# Patient Record
Sex: Female | Born: 1955 | Race: White | Hispanic: No | Marital: Single | Smoking: Never smoker
Health system: Southern US, Community
[De-identification: ages and names within clinical notes are randomized; demographics above are authoritative.]

## PROBLEM LIST (undated history)

## (undated) DIAGNOSIS — D649 Anemia, unspecified: Secondary | ICD-10-CM

## (undated) DIAGNOSIS — K760 Fatty (change of) liver, not elsewhere classified: Secondary | ICD-10-CM

## (undated) DIAGNOSIS — K219 Gastro-esophageal reflux disease without esophagitis: Secondary | ICD-10-CM

## (undated) DIAGNOSIS — C8330 Diffuse large B-cell lymphoma, unspecified site: Secondary | ICD-10-CM

## (undated) HISTORY — DX: Diffuse large B-cell lymphoma, unspecified site: C83.30

## (undated) HISTORY — DX: Gastro-esophageal reflux disease without esophagitis: K21.9

## (undated) HISTORY — DX: Fatty (change of) liver, not elsewhere classified: K76.0

## (undated) HISTORY — DX: Anemia, unspecified: D64.9

---

## 2002-04-25 HISTORY — PX: UPPER GI ENDOSCOPY: SHX6162

## 2004-03-25 ENCOUNTER — Ambulatory Visit: Payer: Self-pay

## 2005-06-09 ENCOUNTER — Ambulatory Visit: Payer: Self-pay

## 2007-02-08 ENCOUNTER — Ambulatory Visit: Payer: Self-pay

## 2008-03-19 ENCOUNTER — Ambulatory Visit: Payer: Self-pay

## 2010-04-25 HISTORY — PX: COLONOSCOPY: SHX174

## 2010-04-29 ENCOUNTER — Ambulatory Visit: Payer: Self-pay | Admitting: Internal Medicine

## 2011-06-01 ENCOUNTER — Ambulatory Visit: Payer: Self-pay | Admitting: Internal Medicine

## 2012-08-29 ENCOUNTER — Ambulatory Visit: Payer: Self-pay | Admitting: Internal Medicine

## 2013-04-05 ENCOUNTER — Ambulatory Visit: Payer: Self-pay | Admitting: Family Medicine

## 2013-04-16 ENCOUNTER — Ambulatory Visit (INDEPENDENT_AMBULATORY_CARE_PROVIDER_SITE_OTHER): Payer: BC Managed Care – PPO | Admitting: General Surgery

## 2013-04-16 ENCOUNTER — Encounter: Payer: Self-pay | Admitting: General Surgery

## 2013-04-16 VITALS — BP 130/72 | HR 76 | Resp 12 | Ht 66.0 in | Wt 208.0 lb

## 2013-04-16 DIAGNOSIS — K8010 Calculus of gallbladder with chronic cholecystitis without obstruction: Secondary | ICD-10-CM | POA: Insufficient documentation

## 2013-04-16 NOTE — Progress Notes (Signed)
Patient ID: April Nolan, female   DOB: 04-Oct-1955, 57 y.o.   MRN: [id number]  Chief Complaint  Patient presents with  . Abdominal Pain    gall stones    HPI April Nolan is a 57 y.o. female who presents for an evaluation of gallstones. She complains of epigastric abdominal pain that has been present for approximately 4-5 months. She describes the pain as a dull ache as well as stabbing pain at times. She also mentions a feeling of fullness. The pain is constantly there. Eating makes the pain worse. The pain lasts for approximately a few minutes at a time. No nausea or vomiting.    HPI  Past Medical History  Diagnosis Date  . GERD (gastroesophageal reflux disease)     Past Surgical History  Procedure Laterality Date  . Upper gi endoscopy  2004    Dr Mechele Collin  . Colonoscopy  2012    Dr. Mechele Collin    Family History  Problem Relation Age of Onset  . Cancer Father     bone/brain    Social History History  Substance Use Topics  . Smoking status: Never Smoker   . Smokeless tobacco: Not on file  . Alcohol Use: Yes     Comment: 4/week    No Known Allergies  Current Outpatient Prescriptions  Medication Sig Dispense Refill  . Cholecalciferol (VITAMIN D-3) 1000 UNITS CAPS Take by mouth daily.      . citalopram (CELEXA) 20 MG tablet Take 20 mg by mouth daily.       . DiphenhydrAMINE HCl (ALLERGY MED PO) Take by mouth daily.      Marland Kitchen esomeprazole (NEXIUM) 10 MG packet Take 10 mg by mouth daily before breakfast.      . omeprazole (PRILOSEC) 40 MG capsule Take 40 mg by mouth daily.        No current facility-administered medications for this visit.    Review of Systems Review of Systems  Constitutional: Negative.   Respiratory: Negative.   Cardiovascular: Negative.   Gastrointestinal: Positive for abdominal pain.    Blood pressure 130/72, pulse 76, resp. rate 12, height 5\' 6"  (1.676 m), weight 208 lb (94.348 kg).  Physical Exam Physical Exam  Constitutional: She is  oriented to person, place, and time. She appears well-developed and well-nourished.  Eyes: Conjunctivae are normal. No scleral icterus.  Neck: No thyromegaly present.  Cardiovascular: Normal rate, regular rhythm and normal heart sounds.   No murmur heard. Pulmonary/Chest: Effort normal and breath sounds normal.  Abdominal: Soft. Normal appearance and bowel sounds are normal. There is no hepatosplenomegaly. There is no tenderness. No hernia.  Lymphadenopathy:    She has no cervical adenopathy.  Neurological: She is alert and oriented to person, place, and time.  Skin: Skin is warm and dry.    Data Reviewed  Abdominal Ultrasound. MD notes and labs.   Assessment    Symptomatic cholelithiasis. Labs were normal.     Plan    Discussed laparoscopy and cholecystectomy in full. Patient is agreeable.     Patient wishes to check work schedule and call back to arrange surgery date.   Cathy Ropp G 04/16/2013, 3:31 PM

## 2013-04-16 NOTE — Patient Instructions (Addendum)
Patient advised to arrange gallbladder surgery at sometime.   Laparoscopic Cholecystectomy Laparoscopic cholecystectomy is surgery to remove the gallbladder. The gallbladder is located slightly to the right of center in the abdomen, behind the liver. It is a concentrating and storage sac for the bile produced in the liver. Bile aids in the digestion and absorption of fats. Gallbladder disease (cholecystitis) is an inflammation of your gallbladder. This condition is usually caused by a buildup of gallstones (cholelithiasis) in your gallbladder. Gallstones can block the flow of bile, resulting in inflammation and pain. In severe cases, emergency surgery may be required. When emergency surgery is not required, you will have time to prepare for the procedure. Laparoscopic surgery is an alternative to open surgery. Laparoscopic surgery usually has a shorter recovery time. Your common bile duct may also need to be examined and explored. Your caregiver will discuss this with you if he or she feels this should be done. If stones are found in the common bile duct, they may be removed. LET YOUR CAREGIVER KNOW ABOUT:  Allergies to food or medicine.  Medicines taken, including vitamins, herbs, eyedrops, over-the-counter medicines, and creams.  Use of steroids (by mouth or creams).  Previous problems with anesthetics or numbing medicines.  History of bleeding problems or blood clots.  Previous surgery.  Other health problems, including diabetes and kidney problems.  Possibility of pregnancy, if this applies. RISKS AND COMPLICATIONS All surgery is associated with risks. Some problems that may occur following this procedure include:  Infection.  Damage to the common bile duct, nerves, arteries, veins, or other internal organs such as the stomach or intestines.  Bleeding.  A stone may remain in the common bile duct. BEFORE THE PROCEDURE  Do not take aspirin for 3 days prior to surgery or blood  thinners for 1 week prior to surgery.  Do not eat or drink anything after midnight the night before surgery.  Let your caregiver know if you develop a cold or other infectious problem prior to surgery.  You should be present 60 minutes before the procedure or as directed. PROCEDURE  You will be given medicine that makes you sleep (general anesthetic). When you are asleep, your surgeon will make several small cuts (incisions) in your abdomen. One of these incisions is used to insert a small, lighted scope (laparoscope) into the abdomen. The laparoscope helps the surgeon see into your abdomen. Carbon dioxide gas will be pumped into your abdomen. The gas allows more room for the surgeon to perform your surgery. Other operating instruments are inserted through the other incisions. Laparoscopic procedures may not be appropriate when:  There is major scarring from previous surgery.  The gallbladder is extremely inflamed.  There are bleeding disorders or unexpected cirrhosis of the liver.  A pregnancy is near term.  Other conditions make the laparoscopic procedure impossible. If your surgeon feels it is not safe to continue with a laparoscopic procedure, he or she will perform an open abdominal procedure. In this case, the surgeon will make an incision to open the abdomen. This gives the surgeon a larger view and field to work within. This may allow the surgeon to perform procedures that sometimes cannot be performed with a laparoscope alone. Open surgery has a longer recovery time. AFTER THE PROCEDURE  You will be taken to the recovery area where a nurse will watch and check your progress.  You may be allowed to go home the same day.  Do not resume physical activities until directed by  your caregiver.  You may resume a normal diet and activities as directed. Document Released: 04/11/2005 Document Revised: 07/04/2011 Document Reviewed: 11/21/2012 [facility] Patient Information 2014 [place],  [place].  Patient wishes to check work schedule and call back to arrange surgery date.

## 2013-04-23 ENCOUNTER — Ambulatory Visit: Payer: Self-pay | Admitting: General Surgery

## 2013-05-01 ENCOUNTER — Ambulatory Visit: Payer: Self-pay | Admitting: General Surgery

## 2013-12-06 ENCOUNTER — Ambulatory Visit: Payer: Self-pay | Admitting: Unknown Physician Specialty

## 2014-01-02 ENCOUNTER — Ambulatory Visit: Payer: Self-pay | Admitting: Surgery

## 2014-01-03 LAB — PATHOLOGY REPORT

## 2014-02-24 ENCOUNTER — Encounter: Payer: Self-pay | Admitting: General Surgery

## 2014-03-25 ENCOUNTER — Ambulatory Visit: Payer: Self-pay | Admitting: Oncology

## 2014-04-14 ENCOUNTER — Inpatient Hospital Stay: Payer: Self-pay | Admitting: Internal Medicine

## 2014-04-14 LAB — CBC
HCT: 23.2 % — ABNORMAL LOW (ref 35.0–47.0)
HGB: 6.7 g/dL — ABNORMAL LOW (ref 12.0–16.0)
MCH: 18.8 pg — ABNORMAL LOW (ref 26.0–34.0)
MCHC: 28.9 g/dL — ABNORMAL LOW (ref 32.0–36.0)
MCV: 65 fL — ABNORMAL LOW (ref 80–100)
Platelet: 809 10*3/uL — ABNORMAL HIGH (ref 150–440)
RBC: 3.56 10*6/uL — AB (ref 3.80–5.20)
RDW: 18.9 % — AB (ref 11.5–14.5)
WBC: 9.7 10*3/uL (ref 3.6–11.0)

## 2014-04-14 LAB — FERRITIN: Ferritin (ARMC): 10 ng/mL (ref 8–388)

## 2014-04-14 LAB — COMPREHENSIVE METABOLIC PANEL
ALBUMIN: 3 g/dL — AB (ref 3.4–5.0)
ALT: 13 U/L — AB
ANION GAP: 8 (ref 7–16)
AST: 28 U/L (ref 15–37)
Alkaline Phosphatase: 88 U/L
BILIRUBIN TOTAL: 0.7 mg/dL (ref 0.2–1.0)
BUN: 14 mg/dL (ref 7–18)
Calcium, Total: 8.6 mg/dL (ref 8.5–10.1)
Chloride: 105 mmol/L (ref 98–107)
Co2: 25 mmol/L (ref 21–32)
Creatinine: 0.89 mg/dL (ref 0.60–1.30)
EGFR (Non-African Amer.): >60
GLUCOSE: 130 mg/dL — AB (ref 65–99)
Osmolality: 278 (ref 275–301)
Potassium: 3.8 mmol/L (ref 3.5–5.1)
Sodium: 138 mmol/L (ref 136–145)
Total Protein: 7 g/dL (ref 6.4–8.2)

## 2014-04-14 LAB — HEMOGLOBIN: HGB: 7.1 g/dL — ABNORMAL LOW (ref 12.0–16.0)

## 2014-04-14 LAB — IRON: Iron: 17 ug/dL — ABNORMAL LOW (ref 50–170)

## 2014-04-14 LAB — TROPONIN I: Troponin-I: <0.02 ng/mL

## 2014-04-15 LAB — BASIC METABOLIC PANEL
Anion Gap: 7 (ref 7–16)
BUN: 13 mg/dL (ref 7–18)
CALCIUM: 8.1 mg/dL — AB (ref 8.5–10.1)
CREATININE: 0.81 mg/dL (ref 0.60–1.30)
Chloride: 107 mmol/L (ref 98–107)
Co2: 26 mmol/L (ref 21–32)
EGFR (African American): >60
EGFR (Non-African Amer.): >60
GLUCOSE: 111 mg/dL — AB (ref 65–99)
Osmolality: 280 (ref 275–301)
Potassium: 4.1 mmol/L (ref 3.5–5.1)
Sodium: 140 mmol/L (ref 136–145)

## 2014-04-15 LAB — CBC WITH DIFFERENTIAL/PLATELET
Bands: 1 %
Basophil: 1 %
COMMENT - H1-COM8: NORMAL
Eosinophil: 3 %
HCT: 23.4 % — AB (ref 35.0–47.0)
HGB: 6.9 g/dL — ABNORMAL LOW (ref 12.0–16.0)
Lymphocytes: 10 %
MCH: 20.4 pg — ABNORMAL LOW (ref 26.0–34.0)
MCHC: 29.5 g/dL — AB (ref 32.0–36.0)
MCV: 69 fL — ABNORMAL LOW (ref 80–100)
METAMYELOCYTE: 1 %
Monocytes: 9 %
NRBC/100 WBC: 2 /
PLATELETS: 667 10*3/uL — AB (ref 150–440)
RBC: 3.39 10*6/uL — AB (ref 3.80–5.20)
RDW: 19.2 % — ABNORMAL HIGH (ref 11.5–14.5)
SEGMENTED NEUTROPHILS: 75 %
WBC: 8.6 10*3/uL (ref 3.6–11.0)

## 2014-04-15 LAB — HEMOGLOBIN: HGB: 7 g/dL — ABNORMAL LOW (ref 12.0–16.0)

## 2014-04-16 LAB — HEMOGLOBIN: HGB: 8.2 g/dL — ABNORMAL LOW (ref 12.0–16.0)

## 2014-04-17 LAB — CEA

## 2014-04-17 LAB — CA 125: CA 125: 47.5 U/mL — AB (ref 0.0–34.0)

## 2014-04-22 ENCOUNTER — Ambulatory Visit: Payer: Self-pay | Admitting: Internal Medicine

## 2014-04-22 LAB — CBC CANCER CENTER
Basophil #: 0.1 x10 3/mm (ref 0.0–0.1)
Basophil %: 1.1 %
EOS PCT: 1.5 %
Eosinophil #: 0.1 x10 3/mm (ref 0.0–0.7)
HCT: 31 % — ABNORMAL LOW (ref 35.0–47.0)
HGB: 9.4 g/dL — ABNORMAL LOW (ref 12.0–16.0)
Lymphocyte #: 0.7 x10 3/mm — ABNORMAL LOW (ref 1.0–3.6)
Lymphocyte %: 9.6 %
MCH: 21.7 pg — ABNORMAL LOW (ref 26.0–34.0)
MCHC: 30.3 g/dL — ABNORMAL LOW (ref 32.0–36.0)
MCV: 72 fL — ABNORMAL LOW (ref 80–100)
Monocyte #: 0.7 x10 3/mm (ref 0.2–0.9)
Monocyte %: 9.7 %
NEUTROS ABS: 5.5 x10 3/mm (ref 1.4–6.5)
Neutrophil %: 78.1 %
Platelet: 399 x10 3/mm (ref 150–440)
RBC: 4.33 10*6/uL (ref 3.80–5.20)
RDW: 29.5 % — AB (ref 11.5–14.5)
WBC: 7 x10 3/mm (ref 3.6–11.0)

## 2014-04-24 ENCOUNTER — Ambulatory Visit: Payer: Self-pay | Admitting: Oncology

## 2014-04-25 ENCOUNTER — Ambulatory Visit: Payer: Self-pay | Admitting: Oncology

## 2014-04-28 LAB — CBC CANCER CENTER
Basophil #: 0.1 x10 3/mm (ref 0.0–0.1)
Basophil %: 0.8 %
Eosinophil #: 0.1 x10 3/mm (ref 0.0–0.7)
Eosinophil %: 0.7 %
HCT: 29.8 % — AB (ref 35.0–47.0)
HGB: 9.1 g/dL — ABNORMAL LOW (ref 12.0–16.0)
LYMPHS ABS: 0.5 x10 3/mm — AB (ref 1.0–3.6)
LYMPHS PCT: 4.6 %
MCH: 21.3 pg — ABNORMAL LOW (ref 26.0–34.0)
MCHC: 30.6 g/dL — ABNORMAL LOW (ref 32.0–36.0)
MCV: 70 fL — ABNORMAL LOW (ref 80–100)
MONOS PCT: 6.9 %
Monocyte #: 0.8 x10 3/mm (ref 0.2–0.9)
NEUTROS ABS: 9.8 x10 3/mm — AB (ref 1.4–6.5)
Neutrophil %: 87 %
Platelet: 505 x10 3/mm — ABNORMAL HIGH (ref 150–440)
RBC: 4.29 10*6/uL (ref 3.80–5.20)
RDW: 28.5 % — ABNORMAL HIGH (ref 11.5–14.5)
WBC: 11.2 x10 3/mm — AB (ref 3.6–11.0)

## 2014-05-01 ENCOUNTER — Ambulatory Visit: Payer: BC Managed Care – PPO | Admitting: General Surgery

## 2014-05-05 DIAGNOSIS — C8510 Unspecified B-cell lymphoma, unspecified site: Secondary | ICD-10-CM | POA: Insufficient documentation

## 2014-05-05 DIAGNOSIS — C8330 Diffuse large B-cell lymphoma, unspecified site: Secondary | ICD-10-CM

## 2014-05-05 HISTORY — DX: Diffuse large B-cell lymphoma, unspecified site: C83.30

## 2014-05-06 DIAGNOSIS — R1900 Intra-abdominal and pelvic swelling, mass and lump, unspecified site: Secondary | ICD-10-CM | POA: Insufficient documentation

## 2014-05-12 LAB — CBC CANCER CENTER
BANDS NEUTROPHIL: 6 %
BASOS ABS: 0.1 x10 3/mm (ref 0.0–0.1)
Basophil %: 0.2 %
EOS ABS: 0.1 x10 3/mm (ref 0.0–0.7)
EOS PCT: 0.6 %
HCT: 33.9 % — ABNORMAL LOW (ref 35.0–47.0)
HGB: 10.3 g/dL — ABNORMAL LOW (ref 12.0–16.0)
LYMPHS PCT: 4 %
Lymphocyte #: 0.5 x10 3/mm — ABNORMAL LOW (ref 1.0–3.6)
Lymphocyte %: 2.1 %
MCH: 20.8 pg — AB (ref 26.0–34.0)
MCHC: 30.4 g/dL — ABNORMAL LOW (ref 32.0–36.0)
MCV: 69 fL — ABNORMAL LOW (ref 80–100)
MONO ABS: 1.9 x10 3/mm — AB (ref 0.2–0.9)
MONOS PCT: 7.5 %
Metamyelocyte: 3 %
Monocytes: 9 %
Myelocyte: 2 %
NRBC/100 WBC: 1 /100
Neutrophil #: 22.2 x10 3/mm — ABNORMAL HIGH (ref 1.4–6.5)
Neutrophil %: 89.6 %
PLATELETS: 799 x10 3/mm — AB (ref 150–440)
RBC: 4.95 10*6/uL (ref 3.80–5.20)
RDW: 26.9 % — ABNORMAL HIGH (ref 11.5–14.5)
Segmented Neutrophils: 76 %
WBC: 24.8 x10 3/mm — AB (ref 3.6–11.0)

## 2014-05-12 LAB — COMPREHENSIVE METABOLIC PANEL
ALBUMIN: 2.6 g/dL — AB (ref 3.4–5.0)
ANION GAP: 10 (ref 7–16)
AST: 44 U/L — AB (ref 15–37)
Alkaline Phosphatase: 153 U/L — ABNORMAL HIGH
BUN: 17 mg/dL (ref 7–18)
Bilirubin,Total: 0.7 mg/dL (ref 0.2–1.0)
CHLORIDE: 92 mmol/L — AB (ref 98–107)
Calcium, Total: 12.3 mg/dL — ABNORMAL HIGH (ref 8.5–10.1)
Co2: 28 mmol/L (ref 21–32)
Creatinine: 1.04 mg/dL (ref 0.60–1.30)
EGFR (Non-African Amer.): 58 — ABNORMAL LOW
GLUCOSE: 120 mg/dL — AB (ref 65–99)
Osmolality: 264 (ref 275–301)
POTASSIUM: 4.7 mmol/L (ref 3.5–5.1)
SGPT (ALT): 44 U/L
Sodium: 130 mmol/L — ABNORMAL LOW (ref 136–145)
Total Protein: 6.9 g/dL (ref 6.4–8.2)

## 2014-05-12 LAB — LACTATE DEHYDROGENASE: LDH: 276 U/L — AB (ref 81–246)

## 2014-05-15 LAB — CREATININE, SERUM
CREATININE: 0.87 mg/dL (ref 0.60–1.30)
EGFR (African American): >60
EGFR (Non-African Amer.): >60

## 2014-05-15 LAB — CALCIUM: Calcium, Total: 10.2 mg/dL — ABNORMAL HIGH (ref 8.5–10.1)

## 2014-05-15 LAB — URIC ACID: Uric Acid: 3.9 mg/dL (ref 2.6–6.0)

## 2014-05-15 LAB — CANCER CENTER HEMOGLOBIN: HGB: 9 g/dL — AB (ref 12.0–16.0)

## 2014-05-16 LAB — CALCIUM: Calcium, Total: 10 mg/dL (ref 8.5–10.1)

## 2014-05-16 LAB — URIC ACID: Uric Acid: 3 mg/dL (ref 2.6–6.0)

## 2014-05-16 LAB — CREATININE, SERUM
CREATININE: 0.75 mg/dL (ref 0.60–1.30)
EGFR (Non-African Amer.): >60

## 2014-05-16 LAB — CANCER CENTER HEMOGLOBIN: HGB: 9 g/dL — AB (ref 12.0–16.0)

## 2014-05-19 LAB — BASIC METABOLIC PANEL
Anion Gap: 9 (ref 7–16)
BUN: 16 mg/dL (ref 7–18)
Calcium, Total: 8.6 mg/dL (ref 8.5–10.1)
Chloride: 99 mmol/L (ref 98–107)
Co2: 28 mmol/L (ref 21–32)
Creatinine: 0.62 mg/dL (ref 0.60–1.30)
EGFR (African American): >60
EGFR (Non-African Amer.): >60
Glucose: 187 mg/dL — ABNORMAL HIGH (ref 65–99)
OSMOLALITY: 278 (ref 275–301)
POTASSIUM: 4 mmol/L (ref 3.5–5.1)
SODIUM: 136 mmol/L (ref 136–145)

## 2014-05-19 LAB — CBC CANCER CENTER
BASOS PCT: 0.3 %
Basophil #: 0 x10 3/mm (ref 0.0–0.1)
EOS ABS: 0 x10 3/mm (ref 0.0–0.7)
EOS PCT: 0 %
HCT: 26.9 % — ABNORMAL LOW (ref 35.0–47.0)
HGB: 8.2 g/dL — AB (ref 12.0–16.0)
Lymphocyte #: 0.1 x10 3/mm — ABNORMAL LOW (ref 1.0–3.6)
Lymphocyte %: 1.1 %
MCH: 21 pg — ABNORMAL LOW (ref 26.0–34.0)
MCHC: 30.6 g/dL — ABNORMAL LOW (ref 32.0–36.0)
MCV: 68 fL — ABNORMAL LOW (ref 80–100)
MONO ABS: 0 x10 3/mm — AB (ref 0.2–0.9)
MONOS PCT: 0.4 %
NEUTROS ABS: 9.1 x10 3/mm — AB (ref 1.4–6.5)
NEUTROS PCT: 98.2 %
PLATELETS: 333 x10 3/mm (ref 150–440)
RBC: 3.93 10*6/uL (ref 3.80–5.20)
RDW: 27.2 % — ABNORMAL HIGH (ref 11.5–14.5)
WBC: 9.2 x10 3/mm (ref 3.6–11.0)

## 2014-05-21 LAB — CBC CANCER CENTER
BASOS ABS: 0 x10 3/mm (ref 0.0–0.1)
Basophil %: 0.1 %
EOS PCT: 2.3 %
Eosinophil #: 0 x10 3/mm (ref 0.0–0.7)
HCT: 24.6 % — AB (ref 35.0–47.0)
HGB: 7.4 g/dL — ABNORMAL LOW (ref 12.0–16.0)
LYMPHS ABS: 0.1 x10 3/mm — AB (ref 1.0–3.6)
LYMPHS PCT: 28.1 %
MCH: 20.7 pg — ABNORMAL LOW (ref 26.0–34.0)
MCHC: 29.9 g/dL — ABNORMAL LOW (ref 32.0–36.0)
MCV: 69 fL — AB (ref 80–100)
MONOS PCT: 18.8 %
Monocyte #: 0.1 x10 3/mm — ABNORMAL LOW (ref 0.2–0.9)
Neutrophil #: 0.2 x10 3/mm — ABNORMAL LOW (ref 1.4–6.5)
Neutrophil %: 50.7 %
Platelet: 263 x10 3/mm (ref 150–440)
RBC: 3.56 10*6/uL — AB (ref 3.80–5.20)
RDW: 26.2 % — AB (ref 11.5–14.5)
WBC: 0.3 x10 3/mm — AB (ref 3.6–11.0)

## 2014-05-22 LAB — IRON AND TIBC
IRON: 15 ug/dL — AB (ref 50–170)
Iron Bind.Cap.(Total): 296 ug/dL (ref 250–450)
Iron Saturation: 5 %
UNBOUND IRON-BIND. CAP.: 281 ug/dL

## 2014-05-22 LAB — FERRITIN: Ferritin (ARMC): 114 ng/mL (ref 8–388)

## 2014-05-26 ENCOUNTER — Ambulatory Visit: Payer: Self-pay | Admitting: Oncology

## 2014-05-27 ENCOUNTER — Ambulatory Visit: Payer: Self-pay | Admitting: Vascular Surgery

## 2014-05-28 LAB — CBC CANCER CENTER
BASOS ABS: 0 x10 3/mm (ref 0.0–0.1)
Basophil %: 0.2 %
Eosinophil #: 0 x10 3/mm (ref 0.0–0.7)
Eosinophil %: 0 %
HCT: 36.2 % (ref 35.0–47.0)
HGB: 11.2 g/dL — ABNORMAL LOW (ref 12.0–16.0)
LYMPHS ABS: 0.5 x10 3/mm — AB (ref 1.0–3.6)
Lymphocyte %: 2 %
MCH: 23.2 pg — ABNORMAL LOW (ref 26.0–34.0)
MCHC: 31 g/dL — AB (ref 32.0–36.0)
MCV: 75 fL — AB (ref 80–100)
MONOS PCT: 1.7 %
Monocyte #: 0.4 x10 3/mm (ref 0.2–0.9)
Neutrophil #: 24.6 x10 3/mm — ABNORMAL HIGH (ref 1.4–6.5)
Neutrophil %: 96.1 %
PLATELETS: 441 x10 3/mm — AB (ref 150–440)
RBC: 4.83 10*6/uL (ref 3.80–5.20)
RDW: 29.8 % — AB (ref 11.5–14.5)
WBC: 25.6 x10 3/mm — AB (ref 3.6–11.0)

## 2014-06-17 ENCOUNTER — Inpatient Hospital Stay: Payer: Self-pay | Admitting: Internal Medicine

## 2014-06-24 ENCOUNTER — Ambulatory Visit
Admit: 2014-06-24 | Disposition: A | Discharge status: Home / Self Care | Payer: Self-pay | Attending: Oncology | Admitting: Oncology

## 2014-07-22 LAB — COMPREHENSIVE METABOLIC PANEL
ALBUMIN: 3 g/dL — AB
ANION GAP: 6 — AB (ref 7–16)
AST: 18 U/L
Alkaline Phosphatase: 69 U/L
BUN: 13 mg/dL
Bilirubin,Total: 0.6 mg/dL
CALCIUM: 8.7 mg/dL — AB
Chloride: 109 mmol/L
Co2: 26 mmol/L
Creatinine: 0.48 mg/dL
EGFR (African American): >60
EGFR (Non-African Amer.): >60
GLUCOSE: 106 mg/dL — AB
Potassium: 3.6 mmol/L
SGPT (ALT): 9 U/L — ABNORMAL LOW
Sodium: 141 mmol/L
Total Protein: 5.4 g/dL — ABNORMAL LOW

## 2014-07-22 LAB — MAGNESIUM: MAGNESIUM: 1.5 mg/dL — AB

## 2014-07-22 LAB — CBC CANCER CENTER
BASOS PCT: 0.9 %
Basophil #: 0.1 x10 3/mm (ref 0.0–0.1)
EOS ABS: 0.1 x10 3/mm (ref 0.0–0.7)
Eosinophil %: 0.6 %
HCT: 31.1 % — AB (ref 35.0–47.0)
HGB: 10.2 g/dL — ABNORMAL LOW (ref 12.0–16.0)
Lymphocyte #: 0.7 x10 3/mm — ABNORMAL LOW (ref 1.0–3.6)
Lymphocyte %: 6.9 %
MCH: 28.2 pg (ref 26.0–34.0)
MCHC: 32.8 g/dL (ref 32.0–36.0)
MCV: 86 fL (ref 80–100)
Monocyte #: 1.2 x10 3/mm — ABNORMAL HIGH (ref 0.2–0.9)
Monocyte %: 11.4 %
NEUTROS ABS: 8.5 x10 3/mm — AB (ref 1.4–6.5)
Neutrophil %: 80.2 %
Platelet: 396 x10 3/mm (ref 150–440)
RBC: 3.62 10*6/uL — AB (ref 3.80–5.20)
RDW: 24.7 % — ABNORMAL HIGH (ref 11.5–14.5)
WBC: 10.7 x10 3/mm (ref 3.6–11.0)

## 2014-07-22 LAB — LACTATE DEHYDROGENASE: LDH: 202 U/L — ABNORMAL HIGH

## 2014-07-25 ENCOUNTER — Ambulatory Visit
Admit: 2014-07-25 | Disposition: A | Discharge status: Home / Self Care | Payer: Self-pay | Attending: Oncology | Admitting: Oncology

## 2014-07-28 LAB — CREATININE, SERUM: CREATINE, SERUM: 0.48

## 2014-07-29 LAB — CBC CANCER CENTER
Basophil #: 0 x10 3/mm (ref 0.0–0.1)
Basophil %: 2.7 %
EOS PCT: 4.7 %
Eosinophil #: 0.1 x10 3/mm (ref 0.0–0.7)
HCT: 32.7 % — ABNORMAL LOW (ref 35.0–47.0)
HGB: 10.9 g/dL — AB (ref 12.0–16.0)
Lymphocyte #: 0.3 x10 3/mm — ABNORMAL LOW (ref 1.0–3.6)
Lymphocyte %: 18.7 %
MCH: 29 pg (ref 26.0–34.0)
MCHC: 33.2 g/dL (ref 32.0–36.0)
MCV: 87 fL (ref 80–100)
Monocyte #: 0.2 x10 3/mm (ref 0.2–0.9)
Monocyte %: 12.7 %
NEUTROS PCT: 61.2 %
Neutrophil #: 0.8 x10 3/mm — ABNORMAL LOW (ref 1.4–6.5)
PLATELETS: 194 x10 3/mm (ref 150–440)
RBC: 3.75 10*6/uL — AB (ref 3.80–5.20)
RDW: 22.2 % — ABNORMAL HIGH (ref 11.5–14.5)
WBC: 1.4 x10 3/mm — CL (ref 3.6–11.0)

## 2014-07-29 LAB — COMPREHENSIVE METABOLIC PANEL
Albumin: 3.7 g/dL
Alkaline Phosphatase: 77 U/L
Anion Gap: 7 (ref 7–16)
BILIRUBIN TOTAL: 0.9 mg/dL
BUN: 14 mg/dL
Calcium, Total: 9 mg/dL
Chloride: 100 mmol/L — ABNORMAL LOW
Co2: 30 mmol/L
Creatinine: 0.64 mg/dL
Glucose: 118 mg/dL — ABNORMAL HIGH
POTASSIUM: 4 mmol/L
SGOT(AST): 13 U/L — ABNORMAL LOW
SGPT (ALT): 19 U/L
SODIUM: 137 mmol/L
Total Protein: 6.2 g/dL — ABNORMAL LOW

## 2014-08-05 LAB — BASIC METABOLIC PANEL
Anion Gap: 8 (ref 7–16)
BUN: 12 mg/dL
CREATININE: 0.55 mg/dL
Calcium, Total: 9.4 mg/dL
Chloride: 105 mmol/L
Co2: 25 mmol/L
Glucose: 119 mg/dL — ABNORMAL HIGH
POTASSIUM: 3.6 mmol/L
SODIUM: 138 mmol/L

## 2014-08-05 LAB — CBC CANCER CENTER
BASOS ABS: 0.1 x10 3/mm (ref 0.0–0.1)
BASOS PCT: 1 %
EOS ABS: 0.2 x10 3/mm (ref 0.0–0.7)
Eosinophil %: 1.2 %
HCT: 36.3 % (ref 35.0–47.0)
HGB: 11.7 g/dL — AB (ref 12.0–16.0)
LYMPHS PCT: 3.5 %
Lymphocyte #: 0.5 x10 3/mm — ABNORMAL LOW (ref 1.0–3.6)
MCH: 28.7 pg (ref 26.0–34.0)
MCHC: 32.3 g/dL (ref 32.0–36.0)
MCV: 89 fL (ref 80–100)
Monocyte #: 1 x10 3/mm — ABNORMAL HIGH (ref 0.2–0.9)
Monocyte %: 7.3 %
NEUTROS PCT: 87 %
Neutrophil #: 12.4 x10 3/mm — ABNORMAL HIGH (ref 1.4–6.5)
Platelet: 212 x10 3/mm (ref 150–440)
RBC: 4.07 10*6/uL (ref 3.80–5.20)
RDW: 21.6 % — AB (ref 11.5–14.5)
WBC: 14.2 x10 3/mm — ABNORMAL HIGH (ref 3.6–11.0)

## 2014-08-06 ENCOUNTER — Inpatient Hospital Stay
Admit: 2014-08-06 | Disposition: A | Discharge status: Home / Self Care | Payer: Self-pay | Attending: Oncology | Admitting: Oncology

## 2014-08-06 LAB — URINE PH
PH: 8 (ref 4.5–8.0)
PH: 9 (ref 4.5–8.0)
PH: 8 (ref 4.5–8.0)
PH: 9 (ref 4.5–8.0)
Ph: 5 (ref 4.5–8.0)
Ph: 9 (ref 4.5–8.0)
Ph: 8 (ref 4.5–8.0)

## 2014-08-07 LAB — CBC WITH DIFFERENTIAL/PLATELET
Bands: 16 %
HCT: 31.3 % — AB (ref 35.0–47.0)
HGB: 10.3 g/dL — AB (ref 12.0–16.0)
Lymphocytes: 3 %
MCH: 28.9 pg (ref 26.0–34.0)
MCHC: 32.8 g/dL (ref 32.0–36.0)
MCV: 88 fL (ref 80–100)
METAMYELOCYTE: 2 %
Monocytes: 2 %
Platelet: 220 10*3/uL (ref 150–440)
RBC: 3.55 10*6/uL — ABNORMAL LOW (ref 3.80–5.20)
RDW: 21 % — AB (ref 11.5–14.5)
SEGMENTED NEUTROPHILS: 77 %
WBC: 12.6 10*3/uL — AB (ref 3.6–11.0)

## 2014-08-07 LAB — BASIC METABOLIC PANEL
Anion Gap: 8 (ref 7–16)
BUN: 13 mg/dL
CHLORIDE: 103 mmol/L
CO2: 28 mmol/L
Calcium, Total: 8.9 mg/dL
Creatinine: 0.48 mg/dL
EGFR (African American): >60
EGFR (Non-African Amer.): >60
Glucose: 151 mg/dL — ABNORMAL HIGH
Potassium: 3.4 mmol/L — ABNORMAL LOW
SODIUM: 139 mmol/L

## 2014-08-07 LAB — URINE PH
PH: 9 (ref 4.5–8.0)
PH: 9 (ref 4.5–8.0)
PH: 9 (ref 4.5–8.0)
PH: 9 (ref 4.5–8.0)
PH: 9 (ref 4.5–8.0)
Ph: 9 (ref 4.5–8.0)
Ph: 9 (ref 4.5–8.0)
Ph: 9 (ref 4.5–8.0)
Ph: 9 (ref 4.5–8.0)
Ph: 9 (ref 4.5–8.0)
Ph: 9 (ref 4.5–8.0)
Ph: 9 (ref 4.5–8.0)
Ph: 9 (ref 4.5–8.0)

## 2014-08-08 LAB — CBC WITH DIFFERENTIAL/PLATELET
Basophil #: 0 10*3/uL (ref 0.0–0.1)
Basophil %: 0.6 %
EOS PCT: 0.8 %
Eosinophil #: 0 10*3/uL (ref 0.0–0.7)
HCT: 28.2 % — AB (ref 35.0–47.0)
HGB: 9.2 g/dL — AB (ref 12.0–16.0)
Lymphocyte #: 0.4 10*3/uL — ABNORMAL LOW (ref 1.0–3.6)
Lymphocyte %: 7.3 %
MCH: 29.2 pg (ref 26.0–34.0)
MCHC: 32.7 g/dL (ref 32.0–36.0)
MCV: 89 fL (ref 80–100)
MONO ABS: 0.4 x10 3/mm (ref 0.2–0.9)
MONOS PCT: 8.7 %
NEUTROS ABS: 4.1 10*3/uL (ref 1.4–6.5)
Neutrophil %: 82.6 %
Platelet: 213 10*3/uL (ref 150–440)
RBC: 3.15 10*6/uL — ABNORMAL LOW (ref 3.80–5.20)
RDW: 20.7 % — AB (ref 11.5–14.5)
WBC: 5 10*3/uL (ref 3.6–11.0)

## 2014-08-08 LAB — BASIC METABOLIC PANEL
Anion Gap: 6 — ABNORMAL LOW (ref 7–16)
BUN: 10 mg/dL
CO2: 29 mmol/L
Calcium, Total: 8.4 mg/dL — ABNORMAL LOW
Chloride: 106 mmol/L
Creatinine: 0.48 mg/dL
EGFR (African American): >60
GLUCOSE: 89 mg/dL
POTASSIUM: 3.4 mmol/L — AB
Sodium: 141 mmol/L

## 2014-08-08 LAB — URINE PH
PH: 8 (ref 4.5–8.0)
PH: 8 (ref 4.5–8.0)
PH: 8 (ref 4.5–8.0)
PH: 8 (ref 4.5–8.0)
PH: 9 (ref 4.5–8.0)
Ph: 8 (ref 4.5–8.0)
Ph: 8 (ref 4.5–8.0)
Ph: 8 (ref 4.5–8.0)
Ph: 9 (ref 4.5–8.0)
Ph: 9 (ref 4.5–8.0)
Ph: 9 (ref 4.5–8.0)
Ph: 9 (ref 4.5–8.0)

## 2014-08-09 LAB — CBC WITH DIFFERENTIAL/PLATELET
BASOS ABS: 0 10*3/uL (ref 0.0–0.1)
Basophil %: 1 %
Eosinophil #: 0 10*3/uL (ref 0.0–0.7)
Eosinophil %: 1.6 %
HCT: 30.2 % — ABNORMAL LOW (ref 35.0–47.0)
HGB: 10.1 g/dL — ABNORMAL LOW (ref 12.0–16.0)
Lymphocyte #: 0.2 10*3/uL — ABNORMAL LOW (ref 1.0–3.6)
Lymphocyte %: 8 %
MCH: 29.5 pg (ref 26.0–34.0)
MCHC: 33.3 g/dL (ref 32.0–36.0)
MCV: 89 fL (ref 80–100)
MONO ABS: 0.2 x10 3/mm (ref 0.2–0.9)
Monocyte %: 5 %
NEUTROS PCT: 84.4 %
Neutrophil #: 2.5 10*3/uL (ref 1.4–6.5)
Platelet: 282 10*3/uL (ref 150–440)
RBC: 3.41 10*6/uL — ABNORMAL LOW (ref 3.80–5.20)
RDW: 20.6 % — AB (ref 11.5–14.5)
WBC: 3 10*3/uL — ABNORMAL LOW (ref 3.6–11.0)

## 2014-08-09 LAB — BASIC METABOLIC PANEL
ANION GAP: 3 — AB (ref 7–16)
BUN: 11 mg/dL
Calcium, Total: 9 mg/dL
Chloride: 108 mmol/L
Co2: 31 mmol/L
Creatinine: 0.6 mg/dL
EGFR (African American): >60
GLUCOSE: 102 mg/dL — AB
Potassium: 4 mmol/L
Sodium: 142 mmol/L

## 2014-08-09 LAB — URINE PH
PH: 9 (ref 4.5–8.0)
PH: 9 (ref 4.5–8.0)
PH: 9 (ref 4.5–8.0)
Ph: 8 (ref 4.5–8.0)
Ph: 7 (ref 4.5–8.0)
Ph: 8 (ref 4.5–8.0)
Ph: 9 (ref 4.5–8.0)

## 2014-08-11 ENCOUNTER — Ambulatory Visit
Admit: 2014-08-11 | Disposition: A | Discharge status: Home / Self Care | Payer: Self-pay | Attending: Oncology | Admitting: Oncology

## 2014-08-12 LAB — COMPREHENSIVE METABOLIC PANEL
ANION GAP: 2 — AB (ref 7–16)
Albumin: 3.8 g/dL
Alkaline Phosphatase: 68 U/L
BUN: 11 mg/dL
Bilirubin,Total: 0.7 mg/dL
CREATININE: 0.53 mg/dL
Calcium, Total: 8.6 mg/dL — ABNORMAL LOW
Chloride: 108 mmol/L
Co2: 28 mmol/L
EGFR (African American): >60
EGFR (Non-African Amer.): >60
Glucose: 73 mg/dL
POTASSIUM: 3.8 mmol/L
SGOT(AST): 19 U/L
SGPT (ALT): 18 U/L
Sodium: 138 mmol/L
Total Protein: 6.4 g/dL — ABNORMAL LOW

## 2014-08-12 LAB — CBC CANCER CENTER
Basophil #: 0.1 x10 3/mm (ref 0.0–0.1)
Basophil %: 1.3 %
Eosinophil #: 0 x10 3/mm (ref 0.0–0.7)
Eosinophil %: 0.7 %
HCT: 33.3 % — ABNORMAL LOW (ref 35.0–47.0)
HGB: 11.2 g/dL — ABNORMAL LOW (ref 12.0–16.0)
LYMPHS ABS: 0.3 x10 3/mm — AB (ref 1.0–3.6)
LYMPHS PCT: 7.9 %
MCH: 29.8 pg (ref 26.0–34.0)
MCHC: 33.5 g/dL (ref 32.0–36.0)
MCV: 89 fL (ref 80–100)
MONO ABS: 0.3 x10 3/mm (ref 0.2–0.9)
MONOS PCT: 7.2 %
NEUTROS ABS: 3.5 x10 3/mm (ref 1.4–6.5)
Neutrophil %: 82.9 %
PLATELETS: 378 x10 3/mm (ref 150–440)
RBC: 3.75 10*6/uL — AB (ref 3.80–5.20)
RDW: 18.7 % — ABNORMAL HIGH (ref 11.5–14.5)
WBC: 4.2 x10 3/mm (ref 3.6–11.0)

## 2014-08-12 LAB — LACTATE DEHYDROGENASE: LDH: 203 U/L — ABNORMAL HIGH

## 2014-08-16 NOTE — Discharge Summary (Signed)
PATIENT NAME:  April Nolan, April Nolan MR#:  [id number] DATE OF BIRTH:  June 29, 1955  DATE OF ADMISSION:  04/14/2014 DATE OF DISCHARGE:  04/16/2014  PRIMARY CARE PHYSICIAN:  None local; primary care physician is in [place]:  1.  Severe symptomatic anemia.  2.  Tumor.   PROCEDURES: EGD and colonoscopy.   CONDITION: Stable.   CODE STATUS: Full code.   HOME MEDICATIONS: Please refer to the medication reconciliation list.   DIET: Regular diet.   ACTIVITY: As tolerated.   FOLLOW-UP CARE: Follow with PCP and Dr. Oliva Bustard within 1 to 2 weeks. In addition, the patient needs to follow up with surgeon at [facility] within 1 to 2 weeks.   REASON FOR ADMISSION: Dizziness, weakness, anemia and guaiac-positive stool.   HOSPITAL COURSE: The patient is a 59 year old Caucasian female with a history of fatty liver, GERD, presented to the ED with dizziness and weakness for months. The patient also lost weight of 40 pounds since September and decreased appetite. Also, the patient has had epigastric pain on and off. He was noted to have a low hemoglobin by primary care physician and was referred to Dr. Vira Agar, who recommended hospitalization for further evaluation. For detailed history and physical examination, please refer to the admission note dictated by Dr. Lafonda Mosses. Patel. On admission date, the patient's hemoglobin was 6.7, WBC 9.7, platelets 809,000.  Other labs were unremarkable.  Severe symptomatic anemia, microcytic in nature. The patient's stool occult was positive. After admission, the patient was transfused with 2 units of PRBC. The patient's hemoglobin increased to 8.2. The patient got an endoscopy, which is normal, but colonoscopy showed transverse colon tumor. Dr. Oliva Bustard suggested PET scan and give 1 more unit PRBC transfusion before discharge. The patient has no complaints after treatment. He wants to go home and follow up with Dr. Oliva Bustard and surgery clinic at [facility] in [place]. The patient is clinically stable. He was discharged to home yesterday. I discussed the patient's discharge plan with the patient, the patient's family member, Dr. Oliva Bustard, [name], nurse and case manager.   TIME SPENT: About 50 minutes.    ____________________________ Demetrios Loll, MD qc:je D: 04/17/2014 09:22:00 ET T: 04/17/2014 13:39:42 ET JOB#: [id number]  cc: Demetrios Loll, MD, <Dictator> Demetrios Loll MD ELECTRONICALLY SIGNED 04/23/2014 10:41

## 2014-08-16 NOTE — Consult Note (Signed)
Note Type Consult   Subjective: Chief Complaint/Diagnosis:   57 59-year-old lady was admitted in the hospital with anemia.was secondary to GI bleeding.  Hemoglobin dropped to 6.9 g.  CT scan of abdomen revealed abdominal mass21, 2015) HPI:   HIEF COMPLAINT: Dizziness, weakness, anemia, guaiac-positive stools.  OF PRESENT ILLNESS: The patient is a 59 year old white female with history of fatty liver, GERD, who states that she has been having dizziness and weakness ongoing for months. She also has had a 40 pound weight loss since September and a decrease in appetite. Also, has been having on and off epigastric pain. She was seen by her primary care for routine evaluation for these symptoms and had a CBC which showed her hemoglobin was very low, so she was referred to Dr. Vira Agar who recommended that she be hospitalized for further evaluation and transfusion. The patient has not had any hematemesis. She has noted any blood in her stool. She denies any chest pains or shortness of breath. She does feel dizzy with activity and is feeling very weak. Denies any hematuria or any other bleeding.    Review of Systems:  General: weakness  fatigue  Performance Status (ECOG): 1  HEENT: no complaints  Lungs: no complaints  Cardiac: no complaints  GU: no complaints  Musculoskeletal: no complaints  Extremities: no complaints  Skin: no complaints  Neuro: no complaints  Psych: anxiety  Pain ?: No complaints (0, none)  Review of Systems: All other systems were reviewed and found to be negative  Review of Systems:   lower abdominal discomfort over.  period  of  2-3 months.   Allergies:  No Known Allergies:   Significant History/PMH:   Anemia:    Obesity:    Fatty liver:    GERD - Esophageal Reflux:    Colon/EGD:   Smoking History: Smoking History Never Smoked.  PFSH: Family History: noncontributory  Social History: negative alcohol, negative tobacco  Additional Past Medical and Surgical  History: history of gallstones   Home Medications: Medication Instructions Last Modified Date/Time  omeprazole 40 mg oral delayed release capsule 1 cap(s) orally once a day (in the morning) 21-Dec-15 16:06  citalopram 20 mg oral tablet 1 tab(s) orally once a day (in the morning) 21-Dec-15 16:06  Vitamin D3 1000 intl units oral tablet 1 tab(s) orally once a day (in the morning) 21-Dec-15 16:06  Probiotic Formula - oral capsule 1 cap(s) orally once a day (in the morning) 21-Dec-15 16:06   Vital Signs:  :: reviewed from nurse's note   Physical Exam:  General: patient  is lying in the bed.  Not in any acute distress  Mental Status: alert and oriented to person, place and time  Eyes: pale looking sclera   no jaundice  Head, Ears, Nose,Throat: normal, no lesions or deformities  Neck, Thyroid: no thyroid tenderness, enlargement or nodule.  neck supple without massess or tenderness. no adenopathy. no carotid bruit.  Respiratory: no rales, rhonchi, retractions or wheezing  Cardiovascular: heart sounds are normal.oft systolic murmur.  Occasionally irregular heart some  Breast: not examined  Gastrointestinal: no ascites.  Liver and spleen not palpable.  Musculoskeletal: no swelling  of the joint.  Lower extremity no swelling  Skin: no rash.  No ecchymosis.no skin lesions.  Neurological: higher functions are within normal limit.  cranial nerves are intact  Muscle power tone within normal limit.  No focal signs.  No sensory deficit.  Lymphatics: no cervical, axillary, or inguinal lymphadenopathy   Lab Results  Review:  Lab Results   BMP  :  15-Apr-2014 03:40:  Last Resulted Date/Time.140 K+: 4.1 CO2: 26 Chl: 107 BUN: 13 Cre: 0.81 Glu:111(H) Calcium: 8.1(L) . Metabolic Panel  :  [id number] 12:39:  Last Resulted Date/Time.138 K+: 3.8 CO2: 25 Chl: 105 BUN: 14 Cre: 0.89 Glu:130(H) Calcium: 8.6 Alk Phos: 88 ALT: 13(L) AST: 28 T.Prot: 7.0 Alb: 3.0(L) eGFR-AA: >60 eGFR: >60 .   Medical Imaging  Results:   Review Medical Imaging   Abdomen and Pelvis With Contrast 15-Apr-2014 09:37:00: IMPRESSION:  1. Left upper quadrant irregular mass with internal gas and fluid,  possibly representing a mass of the small bowel right mass which is  eroded into the small bowel, with multiple adjacent loops of thick  walled small bowel and within adjacent abnormally thick walled loop  of distal transverse colon and splenic flexure. Surrounding  stranding and edema in the mesenteric and omentum, and surrounding  mesenteric and retroperitoneal adenopathy. Mesenteric and peritoneal  spread of tumor is not excluded given the stranding appearance of  theomentum and mesentery. Top differential diagnostic  considerations are GI stromal tumor, lymphoma, or conceivably  adenocarcinoma of the colon or small bowel with local spread. Tumor  of different origin such as ovarian tumor considered less likely.  Tissue diagnosis recommended.      Electronically Signed    By: Sherryl Barters M.D.    On: 04/15/2014 10:35         Verified By: Carron Curie, M.D.,  Assessment and Plan: Impression:   anemia secondary to GI bleedingscan shows mass in the small bowel with extensive lymphadenopathy and may be peritoneal spread this point in time but transfuse patient to get hemoglobin close to 10 gintravenous iron therapydiscuss situation with nurse practitioner of gastroenterologist possibility of colonoscopyon the result a PET scan would be donemarkers with CEA, CA 125,if colonoscopy does not reveal any diagnosis then direct CT-guided biopsy of appropriate areahad prolong discussion with patient and her cousin sisteris some question about patient going to [facility] as he works at [employer] for further Secondary school teacher: Lenor Provencher, Martie Lee (MD)  (Signed 22-Dec-15 16:13)  Authored: Note Type, CC/HPI, Review of Systems, ALLERGIES, PAST MEDICAL HISTORY, Smoking Cessation, Patient Family Social History, HOME  MEDICATIONS, Vital Signs, Physical Exam, Lab Results Review, Rad Results Review, Assessment and Plan   Last Updated: 22-Dec-15 16:13 by Jobe Gibbon (MD)

## 2014-08-16 NOTE — Consult Note (Signed)
Note Type Consult   Subjective: Chief Complaint/Diagnosis:   [age]-year-old lady was admitted in the hospital with anemia.was secondary to GI bleeding.  Hemoglobin dropped to 6.9 g.  CT scan of abdomen revealed abdominal mass21, 2015) HPI:   HIEF COMPLAINT: Dizziness, weakness, anemia, guaiac-positive stools.  OF PRESENT ILLNESS: The patient is a 59 year old white female with history of fatty liver, GERD, who states that she has been having dizziness and weakness ongoing for months. She also has had a 40 pound weight loss since September and a decrease in appetite. Also, has been having on and off epigastric pain. She was seen by her primary care for routine evaluation for these symptoms and had a CBC which showed her hemoglobin was very low, so she was referred to Dr. Vira Agar who recommended that she be hospitalized for further evaluation and transfusion. The patient has not had any hematemesis. She has noted any blood in her stool. She denies any chest pains or shortness of breath. She does feel dizzy with activity and is feeling very weak. Denies any hematuria or any other bleeding.    Review of Systems:  General: weakness  fatigue  Performance Status (ECOG): 1  HEENT: no complaints  Lungs: no complaints  Cardiac: no complaints  GU: no complaints  Musculoskeletal: no complaints  Extremities: no complaints  Skin: no complaints  Neuro: no complaints  Psych: anxiety  Pain ?: No complaints (0, none)  Review of Systems: All other systems were reviewed and found to be negative  Review of Systems:   lower abdominal discomfort over.  period  of  2-3 months.   Allergies:  No Known Allergies:   Smoking History: Smoking History Never Smoked.  PFSH: Family History: noncontributory  Social History: negative alcohol, negative tobacco  Additional Past Medical and Surgical History: history of gallstones   Home Medications: Medication Instructions Last Modified Date/Time  omeprazole 40 mg  oral delayed release capsule 1 cap(s) orally once a day (in the morning) 21-Dec-15 16:06  citalopram 20 mg oral tablet 1 tab(s) orally once a day (in the morning) 21-Dec-15 16:06  Vitamin D3 1000 intl units oral tablet 1 tab(s) orally once a day (in the morning) 21-Dec-15 16:06  Probiotic Formula - oral capsule 1 cap(s) orally once a day (in the morning) 21-Dec-15 16:06   Vital Signs:  :: reviewed from nurse's note   Physical Exam:  General: patient  is lying in the bed.  Not in any acute distress  Mental Status: alert and oriented to person, place and time  Eyes: pale looking sclera   no jaundice  Head, Ears, Nose,Throat: normal, no lesions or deformities  Neck, Thyroid: no thyroid tenderness, enlargement or nodule.  neck supple without massess or tenderness. no adenopathy. no carotid bruit.  Respiratory: no rales, rhonchi, retractions or wheezing  Cardiovascular: heart sounds are normal.oft systolic murmur.  Occasionally irregular heart some  Breast: not examined  Gastrointestinal: no ascites.  Liver and spleen not palpable.  Musculoskeletal: no swelling  of the joint.  Lower extremity no swelling  Skin: no rash.  No ecchymosis.no skin lesions.  Neurological: higher functions are within normal limit.  cranial nerves are intact  Muscle power tone within normal limit.  No focal signs.  No sensory deficit.  Lymphatics: no cervical, axillary, or inguinal lymphadenopathy   Lab Results Review:  Lab Results   BMP  :  15-Apr-2014 03:40:  Last Resulted Date/Time.140 K+: 4.1 CO2: 26 Chl: 107 BUN: 13 Cre: 0.81 Glu:111(H)  Calcium: 8.1(L) . Metabolic Panel  :  [id number] 12:39:  Last Resulted Date/Time.138 K+: 3.8 CO2: 25 Chl: 105 BUN: 14 Cre: 0.89 Glu:130(H) Calcium: 8.6 Alk Phos: 88 ALT: 13(L) AST: 28 T.Prot: 7.0 Alb: 3.0(L) eGFR-AA: >60 eGFR: >60 .   Medical Imaging Results:   Review Medical Imaging   Abdomen and Pelvis With Contrast 15-Apr-2014 09:37:00: IMPRESSION:  1. Left upper  quadrant irregular mass with internal gas and fluid,  possibly representing a mass of the small bowel right mass which is  eroded into the small bowel, with multiple adjacent loops of thick  walled small bowel and within adjacent abnormally thick walled loop  of distal transverse colon and splenic flexure. Surrounding  stranding and edema in the mesenteric and omentum, and surrounding  mesenteric and retroperitoneal adenopathy. Mesenteric and peritoneal  spread of tumor is not excluded given the stranding appearance of  theomentum and mesentery. Top differential diagnostic  considerations are GI stromal tumor, lymphoma, or conceivably  adenocarcinoma of the colon or small bowel with local spread. Tumor  of different origin such as ovarian tumor considered less likely.  Tissue diagnosis recommended.      Electronically Signed    By: Sherryl Barters M.D.    On: 04/15/2014 10:35         Verified By: Carron Curie, M.D.,  Assessment and Plan: Impression:   anemia secondary to GI bleedingscan shows mass in the small bowel with extensive lymphadenopathy and may be peritoneal spread this point in time but transfuse patient to get hemoglobin close to 10 gintravenous iron therapydiscuss situation with nurse practitioner of gastroenterologist possibility of colonoscopyon the result a PET scan would be donemarkers with CEA, CA 125,if colonoscopy does not reveal any diagnosis then direct CT-guided biopsy of appropriate areahad prolong discussion with patient and her cousin sisteris some question about patient going to [facility] as he works at [employer] for further evaluation. 23, 2015.had colonoscopy done.  And abnormality in transverse colon was found.  Biopsies were done.  There was bleeding from that abnormal area.  I discussed  situation with gastroenterologist..evaluation is pending.also discuss situation with patient and family primary attending as well as pathologist Patient will received 1 more unit of  bloodMay consider intravenous iron for pathology.  Electronic Signatures: Zsofia Prout, Martie Lee (MD)  (Signed 23-Dec-15 16:45)  Authored: Note Type, CC/HPI, Review of Systems, ALLERGIES, Smoking Cessation, Patient Family Social History, HOME MEDICATIONS, Vital Signs, Physical Exam, Lab Results Review, Rad Results Review, Assessment and Plan   Last Updated: 23-Dec-15 16:45 by Jobe Gibbon (MD)

## 2014-08-16 NOTE — Consult Note (Signed)
Brief Consult Note: Diagnosis: anemia, heme positive.   Patient was seen by consultant.   Comments: April Nolan is a very pleasant 59 y/o caucasian female with recent unintentional 40# weight loss, microcytic anemia, abdominal pain, hemoccult positive stool & LUQ mass on exam.  Hx colon polyps on last colonoscopy 2 yrs ago reported.  Differentials include malignancy versus PUD, less likely other non-GI source.  Plan: 1) STAT CT A/P with IV contrast only 2) NPO 3) Will likely need EGD +/- colonoscopy if nothing to explain on CT 4) supportive measures 5) follow H/H & transfuse to keep hgb around 7 grams Thanks for allowing [place] to participate in her care.  Please see full dictated note. #[id number].  Electronic Signatures for Addendum Section:  [name] (NP) (Signed Addendum 22-Dec-15 11:35)  CT results reviewed with Dr Allen Norris & patient.  Surgery/oncology consults ordered.   Electronic Signatures: [name] (NP)  (Signed 22-Dec-15 13:16)  Authored: Brief Consult Note   Last Updated: 22-Dec-15 13:16 by [name] (NP)

## 2014-08-16 NOTE — Op Note (Signed)
PATIENT NAME:  April Nolan, April Nolan MR#:  [id number] DATE OF BIRTH:  12-09-1955  DATE OF PROCEDURE:  01/02/2014  PREOPERATIVE DIAGNOSIS:  Chronic cholecystitis, cholelithiasis.   POSTOPERATIVE DIAGNOSIS:  Chronic cholecystitis, cholelithiasis.  PROCEDURE:  Laparoscopic cholecystectomy, cholangiogram.   SURGEON:  Rochel Brome, MD.   ANESTHESIA:  General.   INDICATIONS:  This 59 year old female has a history of epigastric pains and ultrasound findings of gallstones and surgery was recommended for definitive treatment.   DESCRIPTION OF PROCEDURE:  The patient was placed on the operating table in the supine position under general endotracheal anesthesia. The abdomen was prepared with ChloraPrep and draped in a sterile manner.   A short incision was made in the inferior aspect of the umbilicus and carried down to the deep fascia which was grasped with laryngeal hook and elevated. A Veress needle was inserted, aspirated, and irrigated with a saline solution. Next, the peritoneal cavity was inflated with carbon dioxide. The Veress needle was removed. The 10-mm cannula was inserted. The 10-mm, 0-degree laparoscope was inserted to view the peritoneal cavity. There was some fatty appearance of the liver. Another incision was made in the epigastrium, slightly to the right of the midline, to introduce an 11-mm cannula. Two incisions were made in the lateral aspect of the right upper quadrant to introduce two 5-mm cannulae.   The gallbladder was found to have a slightly thickened wall and was retracted towards the right shoulder. The gallbladder neck was retracted inferiorly and laterally. The porta hepatis was noted. The gallbladder neck was mobilized with incision of the visceral peritoneum. The cystic artery was dissected free from surrounding structures. The cystic duct was dissected free from surrounding structures. The gallbladder neck was further mobilized. A critical view of safety was demonstrated. An  endoclip was placed across the cystic duct adjacent to the neck of the gallbladder. An incision was made in the cystic duct to introduce a Reddick catheter. Half-strength Conray-60 dye was injected as the cholangiogram was done with fluoroscopy demonstrating the biliary tree and prompt flow of dye into the duodenum. No retained stones were seen. The Reddick catheter was removed.   The cystic duct was doubly ligated with endoclips and divided. The cystic artery was controlled with endoclips and divided. The gallbladder was dissected free from the liver with use of hook and cautery.  One other small branch of the cystic artery was controlled with endoclip and divided. The gallbladder was completely separated from the liver. Hemostasis was intact. The gallbladder was delivered up through the infraumbilical incision, opened and suctioned and multiple stones were removed with the stone scoop. The gallbladder is then removed and submitted in formalin with stones for routine pathology. The right upper quadrant was further inspected. The cannulae were removed. Carbon dioxide was allowed to escape from the peritoneal cavity.   Skin incisions were closed with interrupted 5-0 chromic subcuticular sutures, Benzoin, and Steri-Strips. Dressings were applied with paper tape. The patient tolerated the procedure satisfactorily and was then prepared for transfer to the recovery room.    ____________________________ Lenna Sciara. Rochel Brome, MD jws:lt D: 01/02/2014 11:41:36 ET T: 01/02/2014 11:52:49 ET JOB#: [id number]  cc: Loreli Dollar, MD, <Dictator> Loreli Dollar MD ELECTRONICALLY SIGNED 01/03/2014 17:22

## 2014-08-18 LAB — CBC CANCER CENTER
Basophil #: 0 x10 3/mm (ref 0.0–0.1)
Basophil %: 0.9 %
EOS PCT: 2.2 %
Eosinophil #: 0.1 x10 3/mm (ref 0.0–0.7)
HCT: 33 % — ABNORMAL LOW (ref 35.0–47.0)
HGB: 11 g/dL — AB (ref 12.0–16.0)
LYMPHS PCT: 11.3 %
Lymphocyte #: 0.5 x10 3/mm — ABNORMAL LOW (ref 1.0–3.6)
MCH: 29.9 pg (ref 26.0–34.0)
MCHC: 33.3 g/dL (ref 32.0–36.0)
MCV: 90 fL (ref 80–100)
MONOS PCT: 16.5 %
Monocyte #: 0.8 x10 3/mm (ref 0.2–0.9)
NEUTROS ABS: 3.2 x10 3/mm (ref 1.4–6.5)
Neutrophil %: 69.1 %
Platelet: 230 x10 3/mm (ref 150–440)
RBC: 3.67 10*6/uL — ABNORMAL LOW (ref 3.80–5.20)
RDW: 18.4 % — AB (ref 11.5–14.5)
WBC: 4.7 x10 3/mm (ref 3.6–11.0)

## 2014-08-18 LAB — SURGICAL PATHOLOGY

## 2014-08-18 LAB — MAGNESIUM: Magnesium: 1.8 mg/dL

## 2014-08-20 NOTE — H&P (Signed)
PATIENT NAME:  April Nolan, April Nolan MR#:  [id number] DATE OF BIRTH:  11-Jul-1955  DATE OF ADMISSION:  04/14/2014  PRIMARY CARE PROVIDER:   EMERGENCY DEPARTMENT REFERRING PHYSICIAN: Latina Craver, MD  CHIEF COMPLAINT: Dizziness, weakness, anemia, guaiac-positive stools.   HISTORY OF PRESENT ILLNESS: The patient is a 59 year old white female with history of fatty liver, GERD, who states that she has been having dizziness and weakness ongoing for months. She also has had a 40 pound weight loss since September and a decrease in appetite. Also, has been having on and off epigastric pain. She was seen by her primary care for routine evaluation for these symptoms and had a CBC which showed her hemoglobin was very low, so she was referred to Dr. Vira Agar who recommended that she be hospitalized for further evaluation and transfusion. The patient has not had any hematemesis. She has noted any blood in her stool. She denies any chest pains or shortness of breath. She does feel dizzy with activity and is feeling very weak. Denies any hematuria or any other bleeding.   PAST MEDICAL HISTORY: History of fatty liver, history of GERD, history of colonic polyp noted on colonoscopy 2 years ago.   PAST SURGICAL HISTORY: Cholecystectomy.   ALLERGIES: None.   MEDICATIONS: She is on vitamin D3 at 1000 international units daily, probiotic 1 tablet p.o. daily, omeprazole 40 daily, citalopram 20 daily.   SOCIAL HISTORY: Does not smoke. Drinks socially. No drug use.   FAMILY HISTORY: Negative for any GI malignancy, but brother had bladder cancer and some other cancer that she is not sure of.   REVIEW OF SYSTEMS: CONSTITUTIONAL: Denies any fevers. Complains of fatigue, weakness. Complains of abdominal pain. Complains of weight loss.  EYES: No blurred or double vision. No pain. No redness. No inflammation. No glaucoma. No cataracts.  EARS, NOSE, AND THROAT: No tinnitus. No ear pain. No hearing loss. No seasonal or  year-round allergies. No epistaxis. No difficulty swallowing.  RESPIRATORY: Denies any cough, wheezing, hemoptysis.  CARDIOVASCULAR: Denies any chest pain, orthopnea, edema or arrhythmia.  GASTROINTESTINAL: Denies any nausea, vomiting. Complains of abdominal pain. No rectal bleeding. No hematochezia.  GENITOURINARY: Denies any dysuria, hematuria, renal colic or frequency.  ENDOCRINE: Denies any polyuria, nocturia or thyroid problems.  HEMATOLOGIC AND LYMPHATIC: Denies anemia, easy bruisability or bleeding.  SKIN: No acne. No rash.  MUSCULOSKELETAL: Denies any pain in the neck, back or shoulder.  NEUROLOGIC: No CVA, TIA or seizures.  PSYCHIATRIC: Denies anxiety, insomnia or ADD.   PHYSICAL EXAMINATION:  VITAL SIGNS: Temperature 98.6, pulse 101, respirations 18, blood pressure 121/73, O2 of 98%.  GENERAL: The patient is a well-developed female in no acute distress.  HEENT: Head atraumatic, normocephalic. Pupils equally round, reactive to light and accommodation. There is conjunctival pallor. No sclerae icterus. Nasal examination shows no drainage or ulceration. Oropharynx is clear without any exudate.  NECK: Supple without any thyromegaly.  CARDIOVASCULAR: Regular rate and rhythm. No murmurs, rubs, clicks, gallops.  LUNGS: Clear to auscultation bilaterally without any rales, rhonchi, wheezing.  ABDOMEN: There is no tenderness. Positive bowel sounds x 4. No hepatosplenomegaly.  EXTREMITIES: No clubbing, cyanosis, edema.  SKIN: No rash.  LYMPH NODES: Nonpalpable.  MUSCULOSKELETAL: There is no erythema or swelling.  VASCULAR: Good DP, PT pulses.  PSYCHIATRIC: Not anxious or depressed.  NEUROLOGIC: Awake, alert, and oriented x 3.   LABORATORY DATA: WBC 9.7, hemoglobin 6.7, platelet count is 809,000. Glucose 130, BUN 14, creatinine 0.89, sodium 138, potassium 3.8, chloride 105,  CO2 is 25. LFTs showed albumin of 3.0. AST is 13. Troponin less than 0.02.   ASSESSMENT AND PLAN: The patient is a 59  year old with history of colonoscopy 2 years ago. She presents with dizziness, weakness, abdominal pain.  1.  Severe symptomatic anemia, microcytic in nature, guaiac positive. At this time, we will consult GI, possible gastrointestinal bleed. We will place her on proton pump inhibitors b.i.d., transfuse 2 units of packed RBCs, which has been written by the ED physician. The patient has been consented, risks and benefits explained. She is agreeable to transfusion.  2.  Depression. Continue citalopram.  3.  Miscellaneous. The patient will have sequential compression devices for deep venous thrombosis prophylaxis.   TIME SPENT: Fifty minutes on this patient.     ____________________________ Lafonda Mosses. Posey Pronto, MD shp:TT D: 04/14/2014 17:10:19 ET T: 04/14/2014 17:48:02 ET JOB#: [id number]  cc: Lannis Lichtenwalner H. Posey Pronto, MD, <Dictator> Alric Seton MD ELECTRONICALLY SIGNED 04/27/2014 12:15

## 2014-08-20 NOTE — Consult Note (Signed)
PATIENT NAME:  April Nolan, April Nolan MR#:  [id number] DATE OF BIRTH:  10/13/1955  DATE OF CONSULTATION:  04/15/2014  REFERRING PHYSICIAN:  Shreyang H. Posey Pronto, MD CONSULTING PHYSICIAN:  [name], NP  GASTROENTEROLOGY CONSULTATION   CONSULTING GASTROENTEROLOGIST: Lucilla Lame, MD  PRIMARY GASTROENTEROLOGIST: Manya Silvas, MD   REASON FOR CONSULTATION: Anemia and Hemoccult-positive stool.   HISTORY OF PRESENT ILLNESS: April Nolan is a very pleasant 59 year old female who was in her usual state of health, who has noticed she has had a 40 pound weight loss over the last 3 to 4 months, and intermittent epigastric pain, dizziness and weakness. She went to her primary care physician and had a CBC that showed anemia, so she was sent to Dr. Vira Agar  and Dawson Bills, nurse practitioner, who recommended that she be hospitalized. She was admitted with a hemoglobin of 6.7, up to 6.9 now, MCV of 69, iron 17, ferritin 10. She denies any rectal bleeding or melena. She was found to be Hemoccult positive, as she generally has a bowel movement every 2 days or so. She has been using fiber for this. She has a history of chronic GERD, for which she takes omeprazole 40 mg daily. She has been having some intermittent bilateral lower quadrant abdominal pain which is 3/10 on a pain scale. It is somewhat constant but can wax and wane.  She reports a colonoscopy done in [place] 2 years ago, and she was found to have polyps. She denies ever having an EGD. She denies dysphagia or odynophagia. She denies any known history of anemia.   PAST MEDICAL AND SURGICAL HISTORY: Depression, acid reflux, fatty liver, cholecystectomy in September 2015.   MEDICATIONS PRIOR TO ADMISSION: Vitamin D3 1000 international units daily, probiotics once daily, omeprazole 40 mg daily, citalopram 20 mg daily.   SOCIAL HISTORY: She is an Web designer at [employer]. She is single. She does not have any children. She denies any tobacco use or  illicit drug use. She does consume a beer once or twice per week.   FAMILY HISTORY: There is no known family history of colorectal carcinoma, liver or chronic GI problems. Brother had bladder cancer, and another cancer of unknown etiology.   REVIEW OF SYSTEMS: See HPI. Constitutional: Fatigue, weakness. Otherwise, negative 12 point review of systems.   PHYSICAL EXAMINATION:  VITAL SIGNS:  BMI 27.5, height 65. Weight 165 pounds. Temperature 99.1, pulse 93, respirations 18, blood pressure 115/80, oxygen saturation 96% on room air.  GENERAL: She is a well-developed, pale-appearing Caucasian female who is alert, oriented, pleasant, cooperative, in no acute distress.  HEENT: Sclerae clear, nonicteric. Conjunctivae pink. Oropharynx pink and moist without lesions.  NECK: Supple, without mass or thyromegaly.  CHEST: Heart regular rate and rhythm. Normal S1, S2. No murmurs, clicks, rubs, or gallops.  LUNGS: Clear to auscultation bilaterally.  ABDOMEN: Positive bowel sounds x 4. She does have mild generalized abdominal tenderness throughout her entire abdomen. There is a discrete mass located in the left upper quadrant, which is firm, just below the left costal margin, and greater than 5 cm. No fluid wave. No rebound, tenderness or guarding.  EXTREMITIES: Without clubbing, cyanosis, or edema.  SKIN: Pale, warm and dry, without rash or jaundice.  NEUROLOGIC: Grossly intact.  MUSCULOSKELETAL: Good equal movement and strength bilaterally.  PSYCHIATRIC: Normal mood and affect. Cooperative.   LABORATORY STUDIES: Glucose 111, calcium 8.1; otherwise normal basic metabolic panel. LFTs are normal, except for an albumin of 3. Troponin negative. Hemoglobin 6.9 this  morning, white blood cell count 8.6, hematocrit 23.4, platelets 667,000.   IMPRESSION: April Nolan is a very pleasant 60 year old Caucasian female with a recent unintentional 40 pound weight loss, microcytic anemia, abdominal pain, Hemoccult-positive  stool, and left upper quadrant mass on exam. She has a history of colon polyps on last colonoscopy 2 years ago that was done in [place], although I do not have this report. Differentials include malignancy versus peptic ulcer disease; less likely other non gastrointestinal source. I have discussed her care with [name], and we will proceed with an IV contrast only CT scan to further evaluate the left upper quadrant mass, followed by endoscopies as needed.   PLAN:  1.  Stat CT abdomen and pelvis with IV contrast only.  2.  N.p.o.  3.  Will likely need EGD, plus or minus colonoscopy if nothing to explain on CT.  4.  Supportive measures.  5.  Follow hemoglobin and hematocrit and transfuse to keep hemoglobin around 7 g.   Thank you for allowing [place] to participate in her care.    These services were provided by [name], NP, under collaborative agreement with Lucilla Lame, MD.   ____________________________ [name], NP klj:MT D: 04/15/2014 13:15:54 ET T: 04/15/2014 13:40:31 ET JOB#: [id number]  cc: [name], NP, <Dictator> Manya Silvas, MD [name] FNP ELECTRONICALLY SIGNED 05/07/2014 14:58

## 2014-08-22 ENCOUNTER — Other Ambulatory Visit: Payer: Self-pay | Admitting: *Deleted

## 2014-08-22 DIAGNOSIS — C8590 Non-Hodgkin lymphoma, unspecified, unspecified site: Secondary | ICD-10-CM

## 2014-08-24 NOTE — Discharge Summary (Signed)
PATIENT NAME:  April Nolan, BEGUE MR#:  [id number] DATE OF BIRTH:  [birth year]  DATE OF ADMISSION:  06/17/2014 DATE OF DISCHARGE:  06/22/2014  DISCHARGE DIAGNOSIS: B-cell lymphoma--admitted for high-dose IV methotrexate chemotherapy.   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 Oliva Bustard. The patient was electively admitted on February 23 to oncology floor to facilitate high-dose intravenous methotrexate infusion and leucovorin rescue, IV hydration and alkalinization of urine.   For past medical history and surgical history, home medications, social history, review of systems, exam-please refer to history and physical note for details.   HOSPITAL COURSE: The patient was admitted to [facility] oncology floor. Labs on admission showed adequate WBC of 17,800 with ANC 15,200, hemoglobin 10.6, platelets 299K, creatinine 0.62 and liver functions mostly unremarkable except low albumin of 2.4. Serum LDH was 258. Uric acid normal at 3.7. Serum potassium slightly low at 3.2 and this was supplemented both IV and p.o. as indicated by levels, which was monitored on a regular basis. The patient was given cycle 1 high-dose methotrexate 3.5 grams per meter squared IV on February 24 after she was started on vigorous IV hydration and alkalinization upon admission on February 23. Urine output, renal function, electrolyte levels were closely monitored. Urine pH was closely monitored and sodium bicarbonate supplement was additionally given to keep pH consistently greater than 7.0. The patient did well during hospitalization without any major side effects of sickness. Serum methotrexate level was monitored starting at 24 hours after start of infusion. The 24-hour level was 1.72, 48-hour level was 0.29, 72-hour level was 0.1 and 96-hour level on February 28 was 0.06.  Renal function remained unremarkable, and she had adequate urine output. Oral intake was good. She had no mucositis, nausea, vomiting, diarrhea. The patient was discharged home by Dr. Mike Gip on February 28.  DISCHARGE MEDICATIONS: Citalopram 20 mg p.o. daily, vitamin D3 at 1000 units once daily, MiraLax 1 packet orally once a day as needed for constipation, omeprazole 40 mg delayed-release capsule once daily, Norco 5/325 mg q. 6 hours  p.r.n. for pain, promethazine 12.5 mg q. 6 hours p.r.n., potassium chloride 20 mEq extended release 1 tablet daily.   DISCHARGE DIET: Regular.   ACTIVITY: As tolerated.   FOLLOWUP: Keep appointment at cancer center with Dr. Oliva Bustard, same as scheduled on 06/25/2014.    ____________________________ Rhett Bannister. Ma Hillock, MD srp:jh D: 06/23/2014 09:27:06 ET T: 06/23/2014 10:18:38 ET JOB#: [id number]  cc: Haelie Clapp R. Ma Hillock, MD, <Dictator> Alveta Heimlich MD ELECTRONICALLY SIGNED 06/23/2014 15:39

## 2014-08-24 NOTE — H&P (Signed)
PATIENT NAME:  April Nolan, April Nolan MR#:  [id number] DATE OF BIRTH:  [birth year]  DATE OF ADMISSION:  06/17/2014  CHIEF COMPLAINT AND REASON FOR ADMISSION:  High risk, diffuse, large B cell lymphoma, admitted for high-dose intravenous methotrexate infusion and leucovorin rescue, IV hydration, alkalinization of urine.   HISTORY OF PRESENT ILLNESS: The patient is a 59 year old female with past medical history significant for gastroesophageal reflux disease, anemia, obesity, fatty liver, history of gallstones, status post cholecystectomy, and recently diagnosed diffuse large B cell lymphoma with elevated LDH of 276. The patient follows with Dr. Oliva Bustard, and most recently received cycle 2 chemotherapy with Rituxan-CHOP regimen on 06/04/2014, followed by Neulasta injection 6 mg subcutaneous on day 2. The patient also has iron deficiency anemia and recently has received IV Feraheme with improvement of hemoglobin. Given high risk lymphoma, she was planned for CNS prophylaxis with high-dose IV methotrexate chemotherapy and is here for this today. Clinically, she states that she has some fatigue on exertion, but otherwise has noticed improvement in the upper abdominal mass and bloating along with GI symptoms. She denies any fevers or chills. No major night sweats. Denies any new headaches, visual disturbances, gait disturbances. No new paresthesias in extremities. Physically active. No new dyspnea, chest pain, orthopnea, or PND. No new leg swelling. No new skin rash. No bleeding issues.   PAST MEDICAL HISTORY AND PAST SURGICAL HISTORY: As in HPI above. Lymphoma was diagnosed on 05/05/2014 by abdominal mass biopsy, which was done surgically.   ALLERGIES: No known drug allergies.   HOME MEDICATIONS:  Norco 5/325 mg q. 6 hours p.r.n. for pain, citalopram 20 mg p.o. daily, MiraLAX 17 grams once daily p.r.n. for constipation, Prilosec extended release 40 mg once daily, K-Dur 20 mEq p.o. daily, Phenergan 12.5 mg q. 6 hours  p.r.n. for nausea, vitamin D3 1000 units q.a.m.   FAMILY HISTORY: Noncontributory.   SOCIAL HISTORY: Denies history of smoking, alcohol, or recreational drug usage.   REVIEW OF SYSTEMS: CONSTITUTIONAL: As in HPI. No fevers or chills.  HEENT: No new headaches, dizziness, epistaxis, ear or jaw pain. No sinus symptoms.  CARDIAC: No angina, palpitation, orthopnea, or PND.  LUNGS: As in HPI. No new cough, hemoptysis, or chest pain.  GASTROINTESTINAL: No nausea or vomiting. No diarrhea. No bright red blood in stools or melena.  GENITOURINARY: No dysuria or hematuria.  MUSCULOSKELETAL: No new bone pains.  EXTREMITIES: No new swelling or pain.  SKIN: No new rashes or pruritus.  HEMATOLOGIC: No bleeding issues.  NEUROLOGIC: No new focal weakness, seizures, loss of consciousness, or paresthesias in extremities.  ENDOCRINE: No polyuria or polydipsia. Appetite is good.  PERFORMANCE STATUS ECOG: One.  PHYSICAL EXAMINATION: GENERAL: The patient is a moderately built and nourished individual, alert and oriented, and converses appropriately. No icterus. Mild pallor.  VITAL SIGNS:  98, 101, 20, 101/66, 97% on room air.  HEENT: Normocephalic, atraumatic. Extraocular movements intact. Sclerae anicteric. No oral thrush. Mouth is moist.  NECK: Negative for lymphadenopathy or JVD.  CARDIOVASCULAR: S1, S2, regular rate and rhythm.  LUNGS: Bilateral good air entry, no crepitations or rhonchi noted.  ABDOMEN: Soft, nondistended, nontender. Bowel sounds normally heard. No hepatosplenomegaly clinically.  EXTREMITIES: No major edema or cyanosis.  SKIN: No generalized rashes or major bruising.  NEUROLOGIC: Grossly nonfocal, cranial nerves intact. Gait is unremarkable.  LYMPHATICS: No adenopathy in the axillary or inguinal areas.   LABORATORY RESULTS:  WBC 17,100, ANC 15,200, hemoglobin 10.6, platelets 299,000, creatinine 0.62, potassium 3.2, calcium 7.8, bilirubin 0.4,  alkaline phosphatase 118, ALT 20, AST 10,  albumin 2.4, glucose 116, LDH 258. Uric acid 3.7.   IMAGING:  CT scan of the abdomen and pelvis on 06/11/2014 after 2 cycles of chemotherapy, marked improvement in the large cavitary upper abdominal mass and multiple abdominal lymph nodes. At the site of cavitary mass, there is a small residual fluid collection or cavitary mass. No evidence of bowel obstruction or abscess. Solid parenchymal organs appear stable.   IMPRESSION:  A 59 year old female patient with history of a high risk, diffuse, large B cell lymphoma, status post 2 cycles of Rituxan-CHOP chemotherapy, which she received on February 10. Followup CT scan on February 17 showed excellent response of lymphoma to chemotherapy. The patient has been planned by her primary oncologist, Dr. Oliva Bustard, for central nervous system prophylaxis of lymphoma with high-dose intravenous methotrexate. Currently, she is doing fairly steady without any active complaints, oral intake is good. No gastrointestinal symptoms. CT scan does not report any ascites or obvious fluid collections. Renal function is normal with good creatinine clearance. The patient has been explained about rationale and benefits of high-dose intravenous methotrexate for central nervous system prophylaxis of lymphoma and also explained overall possible side effect profile in detail. She is agreeable to pursue this treatment and expressed verbal consent. She will be hospitalized to 1C oncology floor for this treatment.   PLAN:   1.  Start IV hydration, D5 half-normal saline with sodium bicarbonate 8.4%, 100 mEq/L at 150 mL per hour today, along with potassium chloride supplement for vigorous IV hydration and alkalinization of urine. Will start monitoring urine pH February 24 a.m., and if urine pH maintains greater than 7.0 for at least 4 hours and urine output maintains at least 100 mL per hour or more for at least 4 hours, then we will proceed with high-dose IV methotrexate chemotherapy 3.5 gram/m2  given over 3 hour infusion. Will monitor urine pH once every 2 hours and give oral sodium bicarbonate supplement 1300 mg q. 2 hours if urine pH is 7.0 or less. We will start calcium, leucovorin rescue 10 mg/m2 IV q. 6 hours with first dose at 24 hours after the start of methotrexate infusion. Will monitor serum methotrexate levels at 24 hours, 48 hours, 72 hours after start of methotrexate infusion and adjust leucovorin rescue accordingly. Monitor daily metabolic panel, intermittent CBC and LFTs. Will monitor for side effects including GI toxicity, renal toxicity, mucositis, and others.  2.  Will hold PPI since it could decrease methotrexate clearance. 3.  Continue other home medications same as before.  4.  For pain, continue Norco 5/325 mg q. 6 hours p.r.n. for pain and monitor. No acute issues at this time.   The patient explained above details. she is agreeable to this plan.   ____________________________ Rhett Bannister. Ma Hillock, MD srp:LT D: 06/17/2014 21:18:54 ET T: 06/17/2014 21:54:15 ET JOB#: [id number]  cc: Trevor Iha R. Ma Hillock, MD, <Dictator> Alveta Heimlich MD ELECTRONICALLY SIGNED 06/18/2014 7:04

## 2014-08-24 NOTE — Op Note (Signed)
PATIENT NAME:  April Nolan, April Nolan MR#:  [id number] DATE OF BIRTH:  09-13-1955  DATE OF PROCEDURE:  05/27/2014  PREOPERATIVE DIAGNOSIS: Lymphoma.  POSTOPERATIVE DIAGNOSIS:  Lymphoma.  PROCEDURE PERFORMED: Insertion right internal jugular Infuse-a-Port with ultrasound and fluoroscopic guidance.   SURGEON: Katha Cabal, MD   SEDATION:  Versed plus fentanyl.   MONITORING:  Continuous ECG, pulse oximetry and cardiopulmonary monitoring is performed throughout the entire procedure by the interventional radiology nurse.   TOTAL SEDATION TIME: 40 minutes.   ACCESS:  Right IJ.   FLUOROSCOPY TIME: 0.7 minutes.   CONTRAST USED: None.   INDICATIONS: April Nolan is a 59 year old woman who is undergoing treatment for lymphoma and therefore requires appropriate IV access. Risks and benefits for Infuse-a-Port placement are reviewed. The patient has agreed to proceed.   DESCRIPTION OF PROCEDURE: The patient is taken to the special procedure suite, placed in the supine position. After adequate sedation is achieved, her right neck and chest wall are prepped and draped in a sterile fashion.   Ultrasound is placed in a sterile sleeve. Ultrasound is utilized secondary to lack of appropriate landmarks and to avoid vascular injury. Under real-time visualization, jugular vein is identified. It is echolucent and compressible indicating patency. Image is recorded for the permanent record.   Under continuous ultrasound guidance, a Seldinger needle is inserted into the jugular vein. J-wire is advanced under fluoroscopic guidance.   Lidocaine 1% is infiltrated in the soft tissues of the chest wall and a linear incision is created 2 fingerbreadths below the collar bone.  Pocket is then dissected using a combination of both blunt and sharp dissection. Size of the pocket is checked for appropriate size with the hub. Once this has been verified, the catheter itself is pulled subcutaneously from the pocket to the neck  counterincision. Dilator and peel-away sheath are inserted over the wire. Dilator wire removed and the peel-away sheath is then used to advance the catheter, peel-away sheath is removed. The catheter is then adjusted for tip position under fluoroscopic guidance, transected, and connected to the hub. Hub is then slipped into the pocket without difficulty. Hub is then accessed with a Huber needle. It aspirates and irrigates easily.   The pocket incision is then closed in layers using interrupted 3-0 Vicryl for the subcutaneous tissues and 4-0 Monocryl subcuticular. Neck counterincision is closed with 4-0 Monocryl subcuticular. Dermabond is applied to both sites. The patient tolerated the procedure well and there were no immediate complications.    ____________________________ Katha Cabal, MD ggs:LT D: 05/28/2014 13:42:17 ET T: 05/28/2014 16:23:10 ET JOB#: [id number]  cc: Katha Cabal, MD, <Dictator> Martie Lee. Oliva Bustard, MD Katha Cabal MD ELECTRONICALLY SIGNED 05/28/2014 17:45

## 2014-08-25 ENCOUNTER — Inpatient Hospital Stay: Payer: BC Managed Care – PPO | Attending: Oncology

## 2014-08-25 DIAGNOSIS — C8333 Diffuse large B-cell lymphoma, intra-abdominal lymph nodes: Secondary | ICD-10-CM | POA: Diagnosis present

## 2014-08-25 DIAGNOSIS — Z5111 Encounter for antineoplastic chemotherapy: Secondary | ICD-10-CM | POA: Diagnosis not present

## 2014-08-25 DIAGNOSIS — C8590 Non-Hodgkin lymphoma, unspecified, unspecified site: Secondary | ICD-10-CM

## 2014-08-25 DIAGNOSIS — Z79899 Other long term (current) drug therapy: Secondary | ICD-10-CM | POA: Insufficient documentation

## 2014-08-25 DIAGNOSIS — K760 Fatty (change of) liver, not elsewhere classified: Secondary | ICD-10-CM | POA: Insufficient documentation

## 2014-08-25 DIAGNOSIS — Z418 Encounter for other procedures for purposes other than remedying health state: Secondary | ICD-10-CM | POA: Insufficient documentation

## 2014-08-25 DIAGNOSIS — R109 Unspecified abdominal pain: Secondary | ICD-10-CM | POA: Diagnosis not present

## 2014-08-25 DIAGNOSIS — K219 Gastro-esophageal reflux disease without esophagitis: Secondary | ICD-10-CM | POA: Diagnosis not present

## 2014-08-25 LAB — CBC WITH DIFFERENTIAL/PLATELET
Basophils Absolute: 0 10*3/uL (ref 0–0.1)
Eosinophils Absolute: 0.1 10*3/uL (ref 0–0.7)
Eosinophils Relative: 5 %
HCT: 30.8 % — ABNORMAL LOW (ref 35.0–47.0)
HEMOGLOBIN: 10.4 g/dL — AB (ref 12.0–16.0)
Lymphocytes Relative: 20 %
Lymphs Abs: 0.2 10*3/uL — ABNORMAL LOW (ref 1.0–3.6)
MCH: 30.5 pg (ref 26.0–34.0)
MCHC: 33.8 g/dL (ref 32.0–36.0)
MCV: 90.1 fL (ref 80.0–100.0)
Monocytes Absolute: 0 10*3/uL — ABNORMAL LOW (ref 0.2–0.9)
Monocytes Relative: 4 %
Neutro Abs: 0.8 10*3/uL — ABNORMAL LOW (ref 1.4–6.5)
Platelets: 214 10*3/uL (ref 150–440)
RBC: 3.42 MIL/uL — AB (ref 3.80–5.20)
RDW: 15.6 % — AB (ref 11.5–14.5)
WBC: 1.2 10*3/uL — CL (ref 3.6–11.0)

## 2014-09-01 ENCOUNTER — Inpatient Hospital Stay: Payer: BC Managed Care – PPO

## 2014-09-01 DIAGNOSIS — C8333 Diffuse large B-cell lymphoma, intra-abdominal lymph nodes: Secondary | ICD-10-CM | POA: Diagnosis not present

## 2014-09-01 DIAGNOSIS — C8590 Non-Hodgkin lymphoma, unspecified, unspecified site: Secondary | ICD-10-CM

## 2014-09-01 LAB — CBC WITH DIFFERENTIAL/PLATELET
BASOS ABS: 0.1 10*3/uL (ref 0–0.1)
Eosinophils Absolute: 0.1 10*3/uL (ref 0–0.7)
HEMATOCRIT: 32.9 % — AB (ref 35.0–47.0)
Hemoglobin: 11 g/dL — ABNORMAL LOW (ref 12.0–16.0)
Lymphocytes Relative: 24 %
Lymphs Abs: 0.3 10*3/uL — ABNORMAL LOW (ref 1.0–3.6)
MCH: 30.5 pg (ref 26.0–34.0)
MCHC: 33.5 g/dL (ref 32.0–36.0)
MCV: 91 fL (ref 80.0–100.0)
MONO ABS: 0.7 10*3/uL (ref 0.2–0.9)
Monocytes Relative: 45 %
Neutro Abs: 0.3 10*3/uL — ABNORMAL LOW (ref 1.4–6.5)
Neutrophils Relative %: 19 %
Platelets: 265 10*3/uL (ref 150–440)
RBC: 3.62 MIL/uL — ABNORMAL LOW (ref 3.80–5.20)
RDW: 15.7 % — AB (ref 11.5–14.5)
WBC: 1.4 10*3/uL — CL (ref 3.6–11.0)

## 2014-09-05 ENCOUNTER — Encounter: Payer: Self-pay | Admitting: Oncology

## 2014-09-05 ENCOUNTER — Other Ambulatory Visit: Payer: Self-pay | Admitting: *Deleted

## 2014-09-05 ENCOUNTER — Other Ambulatory Visit: Payer: Self-pay | Admitting: Oncology

## 2014-09-05 DIAGNOSIS — K219 Gastro-esophageal reflux disease without esophagitis: Secondary | ICD-10-CM | POA: Insufficient documentation

## 2014-09-05 DIAGNOSIS — F329 Major depressive disorder, single episode, unspecified: Secondary | ICD-10-CM | POA: Insufficient documentation

## 2014-09-05 DIAGNOSIS — Z86010 Personal history of colonic polyps: Secondary | ICD-10-CM | POA: Insufficient documentation

## 2014-09-05 DIAGNOSIS — K8020 Calculus of gallbladder without cholecystitis without obstruction: Secondary | ICD-10-CM | POA: Insufficient documentation

## 2014-09-05 DIAGNOSIS — C8590 Non-Hodgkin lymphoma, unspecified, unspecified site: Secondary | ICD-10-CM

## 2014-09-05 DIAGNOSIS — E7800 Pure hypercholesterolemia, unspecified: Secondary | ICD-10-CM | POA: Insufficient documentation

## 2014-09-05 DIAGNOSIS — J309 Allergic rhinitis, unspecified: Secondary | ICD-10-CM | POA: Insufficient documentation

## 2014-09-05 DIAGNOSIS — F419 Anxiety disorder, unspecified: Secondary | ICD-10-CM | POA: Insufficient documentation

## 2014-09-05 DIAGNOSIS — R109 Unspecified abdominal pain: Secondary | ICD-10-CM | POA: Insufficient documentation

## 2014-09-05 DIAGNOSIS — F32A Depression, unspecified: Secondary | ICD-10-CM | POA: Insufficient documentation

## 2014-09-05 DIAGNOSIS — R160 Hepatomegaly, not elsewhere classified: Secondary | ICD-10-CM | POA: Insufficient documentation

## 2014-09-05 DIAGNOSIS — K760 Fatty (change of) liver, not elsewhere classified: Secondary | ICD-10-CM | POA: Insufficient documentation

## 2014-09-05 DIAGNOSIS — C8330 Diffuse large B-cell lymphoma, unspecified site: Secondary | ICD-10-CM | POA: Insufficient documentation

## 2014-09-08 ENCOUNTER — Inpatient Hospital Stay: Payer: BC Managed Care – PPO

## 2014-09-08 ENCOUNTER — Telehealth: Payer: Self-pay | Admitting: *Deleted

## 2014-09-08 ENCOUNTER — Other Ambulatory Visit: Payer: Self-pay | Admitting: *Deleted

## 2014-09-08 ENCOUNTER — Encounter (INDEPENDENT_AMBULATORY_CARE_PROVIDER_SITE_OTHER): Payer: Self-pay

## 2014-09-08 ENCOUNTER — Inpatient Hospital Stay (HOSPITAL_BASED_OUTPATIENT_CLINIC_OR_DEPARTMENT_OTHER): Payer: BC Managed Care – PPO | Admitting: Oncology

## 2014-09-08 VITALS — BP 130/84 | HR 78 | Resp 20

## 2014-09-08 VITALS — BP 129/82 | HR 80 | Wt 160.9 lb

## 2014-09-08 DIAGNOSIS — K760 Fatty (change of) liver, not elsewhere classified: Secondary | ICD-10-CM

## 2014-09-08 DIAGNOSIS — C8330 Diffuse large B-cell lymphoma, unspecified site: Secondary | ICD-10-CM

## 2014-09-08 DIAGNOSIS — R109 Unspecified abdominal pain: Secondary | ICD-10-CM | POA: Diagnosis not present

## 2014-09-08 DIAGNOSIS — Z79899 Other long term (current) drug therapy: Secondary | ICD-10-CM

## 2014-09-08 DIAGNOSIS — K219 Gastro-esophageal reflux disease without esophagitis: Secondary | ICD-10-CM | POA: Diagnosis not present

## 2014-09-08 DIAGNOSIS — C8590 Non-Hodgkin lymphoma, unspecified, unspecified site: Secondary | ICD-10-CM

## 2014-09-08 DIAGNOSIS — C8333 Diffuse large B-cell lymphoma, intra-abdominal lymph nodes: Secondary | ICD-10-CM

## 2014-09-08 LAB — CBC WITH DIFFERENTIAL/PLATELET
BAND NEUTROPHILS: 3 % (ref 0–10)
BASOS ABS: 0.1 10*3/uL (ref 0.0–0.1)
Basophils Relative: 1 % (ref 0–1)
Blasts: 0 %
EOS ABS: 0.1 10*3/uL (ref 0.0–0.7)
Eosinophils Relative: 1 % (ref 0–5)
HEMATOCRIT: 35.2 % (ref 35.0–47.0)
Hemoglobin: 11.5 g/dL — ABNORMAL LOW (ref 12.0–16.0)
Lymphocytes Relative: 13 % (ref 12–46)
Lymphs Abs: 0.7 10*3/uL (ref 0.7–4.0)
MCH: 29.3 pg (ref 26.0–34.0)
MCHC: 32.5 g/dL (ref 32.0–36.0)
MCV: 90 fL (ref 80.0–100.0)
MYELOCYTES: 0 %
Metamyelocytes Relative: 0 %
Monocytes Absolute: 0.7 10*3/uL (ref 0.1–1.0)
Monocytes Relative: 13 % — ABNORMAL HIGH (ref 3–12)
Neutro Abs: 3.8 10*3/uL (ref 1.7–7.7)
Neutrophils Relative %: 69 % (ref 43–77)
Other: 0 %
PROMYELOCYTES ABS: 0 %
Platelets: 402 10*3/uL (ref 150–440)
RBC: 3.92 MIL/uL (ref 3.80–5.20)
RDW: 15.9 % — AB (ref 11.5–14.5)
WBC: 5.4 10*3/uL (ref 3.6–11.0)
nRBC: 0 /100 WBC

## 2014-09-08 LAB — COMPREHENSIVE METABOLIC PANEL
ALT: 14 U/L (ref 14–54)
AST: 22 U/L (ref 15–41)
Albumin: 3.7 g/dL (ref 3.5–5.0)
Alkaline Phosphatase: 70 U/L (ref 38–126)
Anion gap: 6 (ref 5–15)
BUN: 13 mg/dL (ref 6–20)
CALCIUM: 9.1 mg/dL (ref 8.9–10.3)
CO2: 28 mmol/L (ref 22–32)
Chloride: 105 mmol/L (ref 101–111)
Creatinine, Ser: 0.56 mg/dL (ref 0.44–1.00)
GFR calc non Af Amer: >60 mL/min (ref >60)
GLUCOSE: 104 mg/dL — AB (ref 65–99)
Potassium: 3.7 mmol/L (ref 3.5–5.1)
SODIUM: 139 mmol/L (ref 135–145)
TOTAL PROTEIN: 6.3 g/dL — AB (ref 6.5–8.1)
Total Bilirubin: 0.5 mg/dL (ref 0.3–1.2)

## 2014-09-08 MED ORDER — SODIUM CHLORIDE 0.9 % IV SOLN
Freq: Once | INTRAVENOUS | Status: AC
  Administered 2014-09-08: 11:00:00 via INTRAVENOUS
  Filled 2014-09-08: qty 1000

## 2014-09-08 MED ORDER — SODIUM CHLORIDE 0.9 % IV SOLN: 375.0000 mg/m2 | Freq: Once | INTRAVENOUS | Status: DC

## 2014-09-08 MED ORDER — VINCRISTINE SULFATE CHEMO INJECTION 1 MG/ML
2.0000 mg | Freq: Once | INTRAVENOUS | Status: AC
  Administered 2014-09-08: 2 mg via INTRAVENOUS
  Filled 2014-09-08: qty 2

## 2014-09-08 MED ORDER — PREDNISONE 50 MG PO TABS: 100.0000 mg | ORAL_TABLET | Freq: Every day | ORAL | Status: DC

## 2014-09-08 MED ORDER — ACETAMINOPHEN 325 MG PO TABS
650.0000 mg | ORAL_TABLET | Freq: Once | ORAL | Status: AC
Start: 2014-09-08 — End: 2014-09-08
  Administered 2014-09-08: 650 mg via ORAL
  Filled 2014-09-08: qty 2

## 2014-09-08 MED ORDER — SODIUM CHLORIDE 0.9 % IV SOLN
Freq: Once | INTRAVENOUS | Status: AC
  Administered 2014-09-08: 11:00:00 via INTRAVENOUS
  Filled 2014-09-08: qty 8

## 2014-09-08 MED ORDER — HEPARIN SOD (PORK) LOCK FLUSH 100 UNIT/ML IV SOLN
500.0000 [IU] | Freq: Once | INTRAVENOUS | Status: AC | PRN
  Administered 2014-09-08: 500 [IU]
  Filled 2014-09-08: qty 5

## 2014-09-08 MED ORDER — DIPHENHYDRAMINE HCL 25 MG PO CAPS
50.0000 mg | ORAL_CAPSULE | Freq: Once | ORAL | Status: AC
  Administered 2014-09-08: 50 mg via ORAL
  Filled 2014-09-08: qty 2

## 2014-09-08 MED ORDER — SODIUM CHLORIDE 0.9 % IV SOLN
375.0000 mg/m2 | Freq: Once | INTRAVENOUS | Status: AC
  Administered 2014-09-08: 700 mg via INTRAVENOUS
  Filled 2014-09-08: qty 60

## 2014-09-08 MED ORDER — LIDOCAINE-PRILOCAINE 2.5-2.5 % EX CREA: 1.0000 "application " | TOPICAL_CREAM | CUTANEOUS | Status: AC | PRN

## 2014-09-08 MED ORDER — DOXORUBICIN HCL CHEMO IV INJECTION 2 MG/ML
50.0000 mg/m2 | Freq: Once | INTRAVENOUS | Status: AC
  Administered 2014-09-08: 90 mg via INTRAVENOUS
  Filled 2014-09-08: qty 45

## 2014-09-08 MED ORDER — SODIUM CHLORIDE 0.9 % IV SOLN
1300.0000 mg | Freq: Once | INTRAVENOUS | Status: AC
  Administered 2014-09-08: 1300 mg via INTRAVENOUS
  Filled 2014-09-08: qty 65

## 2014-09-08 MED ORDER — PEGFILGRASTIM 6 MG/0.6ML ~~LOC~~ PSKT
6.0000 mg | PREFILLED_SYRINGE | Freq: Once | SUBCUTANEOUS | Status: AC
  Administered 2014-09-08: 6 mg via SUBCUTANEOUS
  Filled 2014-09-08: qty 0.6

## 2014-09-08 MED ORDER — ONDANSETRON HCL 8 MG PO TABS: 8.0000 mg | ORAL_TABLET | Freq: Two times a day (BID) | ORAL | Status: DC

## 2014-09-08 MED ORDER — PREDNISONE 20 MG PO TABS: 100.0000 mg | ORAL_TABLET | Freq: Every day | ORAL | Status: DC

## 2014-09-08 NOTE — Telephone Encounter (Signed)
Received rx for Prednisone 20 mg  Take 5 tabs daily on days 1 - 5 with chemo, but disp # was only for 5 tabs, please clarify directions and qty

## 2014-09-08 NOTE — Telephone Encounter (Signed)
Rx resent by Janina Mayo

## 2014-09-10 ENCOUNTER — Ambulatory Visit: Payer: BC Managed Care – PPO

## 2014-09-13 ENCOUNTER — Encounter: Payer: Self-pay | Admitting: Oncology

## 2014-09-13 NOTE — Progress Notes (Signed)
[place] @ [facility] Telephone:(336) [phone]  Fax:(336) [place] OB: [birth year]  MR#: [id number]  NOB#:[id number]  Patient Care Team: Nicoletta Dress, MD as PCP - General (Internal Medicine) Christene Lye, MD (General Surgery) Margarita Rana, MD as Referring Physician (Family Medicine)  CHIEF COMPLAINT:  Chief Complaint  Patient presents with  . Follow-up    Oncology History   1.abnormal PET scan and CT scan Abdominal mass biopsy on 11th of January is consistent with diffuse large cell lymphoma B-cell type Fish panel was negative for any double hit  lymphoma 2.LDH is 276 3.Started on chemotherapy with R CHOP allopurinol was started few days before (May 14, 2014) patient received high-dose methotrexate after cycle 2 4, PET scan has been reviewed (April,18 th 2016) reported to be having excellent respons     Diffuse large B cell lymphoma   09/05/2014 Initial Diagnosis Diffuse large B cell lymphoma    Oncology Flowsheet 09/08/2014  Day, Cycle Day 1, 6  cyclophosphamide (CYTOXAN) IV 1,300 mg  dexamethasone (DECADRON) IV [ 20 mg ]  diphenhydrAMINE (BENADRYL) PO 50 mg  DOXOrubicin (ADRIAMYCIN) IV 50 mg/m2  ondansetron (ZOFRAN) IV [ 16 mg ]  pegfilgrastim (NEULASTA ONPRO KIT) [place] 6 mg  riTUXimab (RITUXAN) IV 375 mg/m2  vinCRIStine (ONCOVIN) IV 2 mg    INTERVAL HISTORY: 59 year old lady with diffuse large B-cell lymphoma here to initiate 6 cycle of chemotherapy.  Patient has tolerated treatment very well.  Has occasional abdominal discomfort.  No nausea no vomiting.  No soreness in the mouth.  No tingling numbness.  Patient is also due for high-dose methotrexate third and the last treatment.  REVIEW OF SYSTEMS:   GENERAL:  Feels good.  Active.  No fevers, sweats or weight loss. PERFORMANCE STATUS (ECOG):  0 HEENT:  No visual changes, runny nose, sore throat, mouth sores or tenderness. Lungs: No shortness of breath or cough.  No hemoptysis. Cardiac:   No chest pain, palpitations, orthopnea, or PND. GI:  No nausea, vomiting, diarrhea, constipation, melena or hematochezia. GU:  No urgency, frequency, dysuria, or hematuria. Musculoskeletal:  No back pain.  No joint pain.  No muscle tenderness. Extremities:  No pain or swelling. Skin:  No rashes or skin changes. Neuro:  No headache, numbness or weakness, balance or coordination issues. Endocrine:  No diabetes, thyroid issues, hot flashes or night sweats. Psych:  No mood changes, depression or anxiety. Pain:  No focal pain. Review of systems:  All other systems reviewed and found to be negative.  As per HPI. Otherwise, a complete review of systems is negatve.  PAST MEDICAL HISTORY: Past Medical History  Diagnosis Date  . GERD (gastroesophageal reflux disease)   . 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 Anemia   . Fatty liver unknown  . 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 Upper gi endoscopy  2004    Dr Vira Agar  . Colonoscopy  2012    Dr. Vira Agar    FAMILY HISTORY Family History  Problem Relation Age of Onset  . Cancer Father     bone/brain    GYNECOLOGIC HISTORY:  No LMP recorded. Patient is postmenopausal.     ADVANCED DIRECTIVES: Does not have any advance care directive   HEALTH MAINTENANCE: History  Substance Use Topics  . Smoking status: Never Smoker   . Smokeless tobacco: Not on file  . Alcohol Use: No  Comment: 4/week     No Known Allergies  Current Outpatient Prescriptions  Medication Sig Dispense Refill  . acetaminophen (TYLENOL) 500 MG tablet Take 500 mg by mouth every 6 (six) hours as needed.    . Cholecalciferol (VITAMIN D-3) 1000 UNITS CAPS Take by mouth daily.    . citalopram (CELEXA) 20 MG tablet Take 20 mg by mouth daily.     . DiphenhydrAMINE HCl (ALLERGY MED PO) Take by mouth daily.    . fluticasone (VERAMYST) 27.5 MCG/SPRAY nasal spray Place 2 sprays  into the nose 2 (two) times daily.    Marland Kitchen HYDROcodone-acetaminophen (NORCO/VICODIN) 5-325 MG per tablet Take 1 tablet by mouth every 6 (six) hours as needed for moderate pain.    Marland Kitchen omeprazole (PRILOSEC) 40 MG capsule Take 40 mg by mouth daily.     . polyethylene glycol (MIRALAX / GLYCOLAX) packet Take 17 g by mouth daily as needed.    . promethazine (PHENERGAN) 12.5 MG tablet Take 12.5 mg by mouth every 6 (six) hours as needed for nausea or vomiting.    Marland Kitchen esomeprazole (NEXIUM) 10 MG packet Take 10 mg by mouth daily before breakfast.    . lidocaine-prilocaine (EMLA) cream Apply 1 application topically as needed. 30 min prior to accessing port 30 g 3  . ondansetron (ZOFRAN) 8 MG tablet Take 1 tablet (8 mg total) by mouth 2 (two) times daily. Start the day after chemo for 3 days. Then as needed for nausea or vomiting. 30 tablet 1  . potassium chloride SA (K-DUR,KLOR-CON) 20 MEQ tablet Take 20 mEq by mouth daily.    . predniSONE (DELTASONE) 50 MG tablet Take 2 tablets (100 mg total) by mouth daily. 10 tablet 0   No current facility-administered medications for this visit.    OBJECTIVE:  Filed Vitals:   09/08/14 0917  BP: 129/82  Pulse: 80     Body mass index is 26.78 kg/(m^2).    ECOG FS:0 - Asymptomatic  PHYSICAL EXAM: General  status: Performance status is good.  Patient has not lost significant weight HEENT: No evidence of stomatitis.  And alopecia Sclera and conjunctivae :: No jaundice.   pale looking . Lungs: Air  entry equal on both sides.  No rhonchi.  No rales.  Cardiac: Heart sounds are normal.  No pericardial rub.  No murmur. Lymphatic system: Cervical, axillary, inguinal, lymph nodes not palpable GI: Abdomen is soft.  No ascites.  Liver spleen not palpable.  No tenderness.  Bowel sounds are within normal limit Lower extremity: No edema Neurological system: Higher functions, cranial nerves intact No evidence of peripheral neuropathy. Skin: No rash.  No ecchymosis.Marland Kitchen     LAB  RESULTS:  Appointment on 09/08/2014  Component Date Value Ref Range Status  . WBC 09/08/2014 5.4  3.6 - 11.0 K/uL Corrected  . RBC 09/08/2014 3.92  3.80 - 5.20 MIL/uL Corrected  . Hemoglobin 09/08/2014 11.5* 12.0 - 16.0 g/dL Corrected  . HCT 09/08/2014 35.2  35.0 - 47.0 % Corrected  . MCV 09/08/2014 90.0  80.0 - 100.0 fL Corrected  . MCH 09/08/2014 29.3  26.0 - 34.0 pg Corrected  . MCHC 09/08/2014 32.5  32.0 - 36.0 g/dL Corrected  . RDW 09/08/2014 15.9* 11.5 - 14.5 % Corrected  . Platelets 09/08/2014 402  150 - 440 K/uL Corrected  . Neutrophils Relative % 09/08/2014 69  43 - 77 % Final  . Lymphocytes Relative 09/08/2014 13  12 - 46 % Final  . Monocytes Relative 09/08/2014 13* 3 -  12 % Final  . Eosinophils Relative 09/08/2014 1  0 - 5 % Final  . Basophils Relative 09/08/2014 1  0 - 1 % Final  . Band Neutrophils 09/08/2014 3  0 - 10 % Final  . Metamyelocytes Relative 09/08/2014 0   Final  . Myelocytes 09/08/2014 0   Final  . Promyelocytes Absolute 09/08/2014 0   Final  . Blasts 09/08/2014 0   Final  . nRBC 09/08/2014 0  0 /100 WBC Final  . Other 09/08/2014 0   Final  . Neutro Abs 09/08/2014 3.8  1.7 - 7.7 K/uL Final  . Lymphs Abs 09/08/2014 0.7  0.7 - 4.0 K/uL Final  . Monocytes Absolute 09/08/2014 0.7  0.1 - 1.0 K/uL Final  . Eosinophils Absolute 09/08/2014 0.1  0.0 - 0.7 K/uL Final  . Basophils Absolute 09/08/2014 0.1  0.0 - 0.1 K/uL Final  . Sodium 09/08/2014 139  135 - 145 mmol/L Final  . Potassium 09/08/2014 3.7  3.5 - 5.1 mmol/L Final  . Chloride 09/08/2014 105  101 - 111 mmol/L Final  . CO2 09/08/2014 28  22 - 32 mmol/L Final  . Glucose, Bld 09/08/2014 104* 65 - 99 mg/dL Final  . BUN 09/08/2014 13  6 - 20 mg/dL Final  . Creatinine, Ser 09/08/2014 0.56  0.44 - 1.00 mg/dL Final  . Calcium 09/08/2014 9.1  8.9 - 10.3 mg/dL Final  . Total Protein 09/08/2014 6.3* 6.5 - 8.1 g/dL Final  . Albumin 09/08/2014 3.7  3.5 - 5.0 g/dL Final  . AST 09/08/2014 22  15 - 41 U/L Final  . ALT  09/08/2014 14  14 - 54 U/L Final  . Alkaline Phosphatase 09/08/2014 70  38 - 126 U/L Final  . Total Bilirubin 09/08/2014 0.5  0.3 - 1.2 mg/dL Final  . GFR calc non Af Amer 09/08/2014 >60  >60 mL/min Final  . GFR calc Af Amer 09/08/2014 >60  >60 mL/min Final   Comment: (NOTE) The eGFR has been calculated using the CKD EPI equation. This calculation has not been validated in all clinical situations. eGFR's persistently <60 mL/min signify possible Chronic Kidney Disease.   . Anion gap 09/08/2014 6  5 - 15 Final    No results found for: LABCA2 No results found for: CA199 Lab Results  Component Value Date   CEA <0.3 04/16/2014   No results found for: PSA Lab Results  Component Value Date   CA125 47.5* 04/16/2014     STUDIES: No results found.  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  At present time patient is responding to the chemotherapy this is a 6 and the last chemotherapy.  MEDICAL DECISION MAKING:  All lab data has been reviewed Repeat PET scan after 6 cycle of chemotherapy High-dose methotrexate in 2 weeks Patient was referred to bone marrow transplant team for consideration of consolidation therapy. Total duration of visit was 45 minutes.  50% or more time was spent in counseling patient and family regarding prognosis and options of treatment and available resources  Patient expressed understanding and was in agreement with this plan. She also understands that She can call clinic at any time with any questions, concerns, or complaints.    Diffuse large B cell lymphoma   Staging form: Lymphoid Neoplasms, AJCC 6th Edition     Clinical: Stage III - Marni Griffon, MD   09/13/2014 9:45 AM

## 2014-09-15 ENCOUNTER — Inpatient Hospital Stay: Payer: BC Managed Care – PPO

## 2014-09-15 DIAGNOSIS — C8333 Diffuse large B-cell lymphoma, intra-abdominal lymph nodes: Secondary | ICD-10-CM | POA: Diagnosis not present

## 2014-09-15 DIAGNOSIS — C8330 Diffuse large B-cell lymphoma, unspecified site: Secondary | ICD-10-CM

## 2014-09-15 LAB — CBC WITH DIFFERENTIAL/PLATELET
Basophils Absolute: 0 10*3/uL (ref 0–0.1)
EOS ABS: 0 10*3/uL (ref 0–0.7)
HCT: 34.6 % — ABNORMAL LOW (ref 35.0–47.0)
Hemoglobin: 11.1 g/dL — ABNORMAL LOW (ref 12.0–16.0)
Lymphocytes Relative: 39 %
Lymphs Abs: 0.3 10*3/uL — ABNORMAL LOW (ref 1.0–3.6)
MCH: 29.2 pg (ref 26.0–34.0)
MCHC: 32 g/dL (ref 32.0–36.0)
MCV: 91.4 fL (ref 80.0–100.0)
Monocytes Absolute: 0.3 10*3/uL (ref 0.2–0.9)
Neutro Abs: 0.2 10*3/uL — ABNORMAL LOW (ref 1.4–6.5)
Neutrophils Relative %: 20 %
Platelets: 132 10*3/uL — ABNORMAL LOW (ref 150–440)
RBC: 3.79 MIL/uL — ABNORMAL LOW (ref 3.80–5.20)
RDW: 15.8 % — AB (ref 11.5–14.5)
WBC: 0.8 10*3/uL — AB (ref 3.6–11.0)

## 2014-09-15 LAB — COMPREHENSIVE METABOLIC PANEL
ALT: 10 U/L — ABNORMAL LOW (ref 14–54)
AST: 12 U/L — ABNORMAL LOW (ref 15–41)
Albumin: 4 g/dL (ref 3.5–5.0)
Alkaline Phosphatase: 84 U/L (ref 38–126)
Anion gap: 5 (ref 5–15)
BILIRUBIN TOTAL: 0.8 mg/dL (ref 0.3–1.2)
BUN: 11 mg/dL (ref 6–20)
CO2: 27 mmol/L (ref 22–32)
Calcium: 8.7 mg/dL — ABNORMAL LOW (ref 8.9–10.3)
Chloride: 103 mmol/L (ref 101–111)
Creatinine, Ser: 0.59 mg/dL (ref 0.44–1.00)
GFR calc Af Amer: >60 mL/min (ref >60)
GLUCOSE: 145 mg/dL — AB (ref 65–99)
Potassium: 3.6 mmol/L (ref 3.5–5.1)
Sodium: 135 mmol/L (ref 135–145)
Total Protein: 6.7 g/dL (ref 6.5–8.1)

## 2014-09-16 ENCOUNTER — Telehealth: Payer: Self-pay | Admitting: *Deleted

## 2014-09-16 DIAGNOSIS — C8590 Non-Hodgkin lymphoma, unspecified, unspecified site: Secondary | ICD-10-CM

## 2014-09-16 MED ORDER — LEVOFLOXACIN 500 MG PO TABS: 500.0000 mg | ORAL_TABLET | Freq: Every day | ORAL | Status: DC

## 2014-09-16 NOTE — Telephone Encounter (Signed)
Called patient @ 8:57 am. Informed her that WBC is low and MD has called Levaquin to [facility]. Instructed patient to pick up medication and begin taking today.  Further instructed patient to call if she develops fever or symptoms.  Patient verbalized understanding.

## 2014-09-16 NOTE — Telephone Encounter (Signed)
Low blood counts from labs yesterday. Prescription has been called into pharmacy at [facility] to help prevent infection. Pt needs to callback if develops fever and notices any new symptoms.

## 2014-09-16 NOTE — Telephone Encounter (Signed)
She needed to know what chemo she has had in the past I spelled the names for her

## 2014-09-17 DIAGNOSIS — F329 Major depressive disorder, single episode, unspecified: Secondary | ICD-10-CM | POA: Insufficient documentation

## 2014-09-17 DIAGNOSIS — F32A Depression, unspecified: Secondary | ICD-10-CM | POA: Insufficient documentation

## 2014-09-17 DIAGNOSIS — F419 Anxiety disorder, unspecified: Secondary | ICD-10-CM

## 2014-09-23 ENCOUNTER — Inpatient Hospital Stay (HOSPITAL_BASED_OUTPATIENT_CLINIC_OR_DEPARTMENT_OTHER): Payer: BC Managed Care – PPO | Admitting: Oncology

## 2014-09-23 ENCOUNTER — Inpatient Hospital Stay: Payer: BC Managed Care – PPO

## 2014-09-23 VITALS — BP 120/81 | HR 70 | Temp 98.0°F | Resp 18 | Ht 65.0 in | Wt 158.7 lb

## 2014-09-23 DIAGNOSIS — C8333 Diffuse large B-cell lymphoma, intra-abdominal lymph nodes: Secondary | ICD-10-CM | POA: Diagnosis not present

## 2014-09-23 DIAGNOSIS — K219 Gastro-esophageal reflux disease without esophagitis: Secondary | ICD-10-CM | POA: Diagnosis not present

## 2014-09-23 DIAGNOSIS — Z79899 Other long term (current) drug therapy: Secondary | ICD-10-CM

## 2014-09-23 DIAGNOSIS — K760 Fatty (change of) liver, not elsewhere classified: Secondary | ICD-10-CM

## 2014-09-23 DIAGNOSIS — C8330 Diffuse large B-cell lymphoma, unspecified site: Secondary | ICD-10-CM

## 2014-09-23 LAB — COMPREHENSIVE METABOLIC PANEL
ALBUMIN: 3.7 g/dL (ref 3.5–5.0)
ALT: 11 U/L — ABNORMAL LOW (ref 14–54)
AST: 18 U/L (ref 15–41)
Alkaline Phosphatase: 81 U/L (ref 38–126)
Anion gap: 5 (ref 5–15)
BUN: 18 mg/dL (ref 6–20)
CO2: 28 mmol/L (ref 22–32)
CREATININE: 0.53 mg/dL (ref 0.44–1.00)
Calcium: 8.9 mg/dL (ref 8.9–10.3)
Chloride: 105 mmol/L (ref 101–111)
GFR calc non Af Amer: >60 mL/min (ref >60)
GLUCOSE: 93 mg/dL (ref 65–99)
Potassium: 3.7 mmol/L (ref 3.5–5.1)
Sodium: 138 mmol/L (ref 135–145)
TOTAL PROTEIN: 6.2 g/dL — AB (ref 6.5–8.1)
Total Bilirubin: 0.5 mg/dL (ref 0.3–1.2)

## 2014-09-23 LAB — CBC WITH DIFFERENTIAL/PLATELET
Basophils Absolute: 0 10*3/uL (ref 0–0.1)
Basophils Relative: 1 %
EOS ABS: 0.1 10*3/uL (ref 0–0.7)
Eosinophils Relative: 1 %
HEMATOCRIT: 34.9 % — AB (ref 35.0–47.0)
HEMOGLOBIN: 11.6 g/dL — AB (ref 12.0–16.0)
Lymphs Abs: 0.6 10*3/uL — ABNORMAL LOW (ref 1.0–3.6)
MCH: 30 pg (ref 26.0–34.0)
MCHC: 33.3 g/dL (ref 32.0–36.0)
MCV: 90.2 fL (ref 80.0–100.0)
MONO ABS: 0.7 10*3/uL (ref 0.2–0.9)
NEUTROS ABS: 7.7 10*3/uL — AB (ref 1.4–6.5)
Platelets: 228 10*3/uL (ref 150–440)
RBC: 3.87 MIL/uL (ref 3.80–5.20)
RDW: 16.6 % — ABNORMAL HIGH (ref 11.5–14.5)
WBC: 9.1 10*3/uL (ref 3.6–11.0)

## 2014-09-24 ENCOUNTER — Encounter: Payer: Self-pay | Admitting: Oncology

## 2014-09-24 ENCOUNTER — Inpatient Hospital Stay
Admission: AD | Admit: 2014-09-24 | Discharge: 2014-09-27 | DRG: 847 | Disposition: A | Discharge status: Home / Self Care | Payer: BC Managed Care – PPO | Source: Ambulatory Visit | Attending: Oncology | Admitting: Oncology

## 2014-09-24 ENCOUNTER — Encounter: Payer: Self-pay | Admitting: *Deleted

## 2014-09-24 DIAGNOSIS — K219 Gastro-esophageal reflux disease without esophagitis: Secondary | ICD-10-CM | POA: Diagnosis present

## 2014-09-24 DIAGNOSIS — Z5111 Encounter for antineoplastic chemotherapy: Secondary | ICD-10-CM | POA: Diagnosis present

## 2014-09-24 DIAGNOSIS — C8333 Diffuse large B-cell lymphoma, intra-abdominal lymph nodes: Secondary | ICD-10-CM | POA: Diagnosis present

## 2014-09-24 DIAGNOSIS — Z808 Family history of malignant neoplasm of other organs or systems: Secondary | ICD-10-CM | POA: Diagnosis not present

## 2014-09-24 DIAGNOSIS — Z79899 Other long term (current) drug therapy: Secondary | ICD-10-CM

## 2014-09-24 DIAGNOSIS — D649 Anemia, unspecified: Secondary | ICD-10-CM | POA: Diagnosis present

## 2014-09-24 DIAGNOSIS — L658 Other specified nonscarring hair loss: Secondary | ICD-10-CM | POA: Diagnosis not present

## 2014-09-24 DIAGNOSIS — K760 Fatty (change of) liver, not elsewhere classified: Secondary | ICD-10-CM | POA: Diagnosis present

## 2014-09-24 DIAGNOSIS — C8330 Diffuse large B-cell lymphoma, unspecified site: Secondary | ICD-10-CM | POA: Diagnosis present

## 2014-09-24 DIAGNOSIS — C8510 Unspecified B-cell lymphoma, unspecified site: Secondary | ICD-10-CM

## 2014-09-24 DIAGNOSIS — Z780 Asymptomatic menopausal state: Secondary | ICD-10-CM | POA: Diagnosis not present

## 2014-09-24 DIAGNOSIS — R231 Pallor: Secondary | ICD-10-CM | POA: Diagnosis not present

## 2014-09-24 LAB — URINALYSIS, DIPSTICK ONLY
PH: 8 (ref 5.0–8.0)
PH: 7 (ref 5.0–8.0)
pH: 5 (ref 5.0–8.0)
pH: 6 (ref 5.0–8.0)
pH: 7 (ref 5.0–8.0)
pH: 9 — ABNORMAL HIGH (ref 5.0–8.0)
pH: 8 (ref 5.0–8.0)

## 2014-09-24 MED ORDER — SODIUM CHLORIDE 0.9 % IV SOLN
12.0000 mg | Freq: Once | INTRAVENOUS | Status: AC
  Administered 2014-09-24: 14:00:00 12 mg via INTRAVENOUS
  Filled 2014-09-24: qty 6

## 2014-09-24 MED ORDER — LEUCOVORIN CALCIUM INJECTION **10 MG/ML**
Freq: Four times a day (QID) | INTRAVENOUS | Status: AC
  Administered 2014-09-25 – 2014-09-27 (×6): via INTRAVENOUS
  Filled 2014-09-24 (×6): qty 2.6

## 2014-09-24 MED ORDER — SODIUM CHLORIDE 0.9 % IV SOLN
INTRAVENOUS | Status: DC
  Administered 2014-09-24 – 2014-09-27 (×4): via INTRAVENOUS
  Filled 2014-09-24 (×9): qty 1000

## 2014-09-24 MED ORDER — ACETAMINOPHEN 325 MG PO TABS: 650.0000 mg | ORAL_TABLET | ORAL | Status: DC | PRN

## 2014-09-24 MED ORDER — SODIUM CHLORIDE 0.9 % IV SOLN
6000.0000 mg | Freq: Once | INTRAVENOUS | Status: AC
  Administered 2014-09-24: 15:00:00 6000 mg via INTRAVENOUS
  Filled 2014-09-24: qty 240

## 2014-09-24 MED ORDER — LEUCOVORIN CALCIUM INJECTION **10 MG/ML**: Freq: Four times a day (QID) | INTRAVENOUS | Status: DC

## 2014-09-24 MED ORDER — SODIUM CHLORIDE 0.9 % IV SOLN
INTRAVENOUS | Status: DC
  Administered 2014-09-24: 09:00:00 via INTRAVENOUS

## 2014-09-24 MED ORDER — SODIUM CHLORIDE 0.9 % IV SOLN
Freq: Once | INTRAVENOUS | Status: DC
  Filled 2014-09-24: qty 1000

## 2014-09-24 MED ORDER — ONDANSETRON 4 MG PO TBDP: 4.0000 mg | ORAL_TABLET | Freq: Three times a day (TID) | ORAL | Status: DC | PRN

## 2014-09-24 MED ORDER — LEUCOVORIN CALCIUM INJECTION 350 MG: 25.0000 mg | Freq: Four times a day (QID) | INTRAVENOUS | Status: DC

## 2014-09-24 MED ORDER — DEXAMETHASONE SODIUM PHOSPHATE 10 MG/ML IJ SOLN
10.0000 mg | Freq: Once | INTRAMUSCULAR | Status: AC
  Administered 2014-09-24: 14:00:00 10 mg via INTRAVENOUS
  Filled 2014-09-24: qty 1

## 2014-09-24 MED ORDER — STERILE WATER FOR INJECTION IV SOLN
INTRAVENOUS | Status: DC
  Administered 2014-09-24: 16:00:00 via INTRAVENOUS
  Filled 2014-09-24 (×5): qty 9.7

## 2014-09-24 MED ORDER — SODIUM BICARBONATE 650 MG PO TABS
1300.0000 mg | ORAL_TABLET | ORAL | Status: DC | PRN
  Filled 2014-09-24: qty 2

## 2014-09-24 MED ORDER — SODIUM CHLORIDE 0.9 % IV SOLN: INTRAVENOUS | Status: DC

## 2014-09-24 MED ORDER — ONDANSETRON HCL 4 MG/2ML IJ SOLN: 4.0000 mg | Freq: Three times a day (TID) | INTRAMUSCULAR | Status: DC | PRN

## 2014-09-24 MED ORDER — SENNOSIDES-DOCUSATE SODIUM 8.6-50 MG PO TABS
1.0000 | ORAL_TABLET | Freq: Every evening | ORAL | Status: DC | PRN
  Administered 2014-09-25: 1 via ORAL
  Filled 2014-09-24: qty 1

## 2014-09-24 MED ORDER — LEUCOVORIN CALCIUM INJECTION 100 MG
Freq: Four times a day (QID) | INTRAVENOUS | Status: DC
  Filled 2014-09-24 (×5): qty 1.3

## 2014-09-24 MED ORDER — SODIUM CHLORIDE 0.9 % IV SOLN
8.0000 mg | Freq: Three times a day (TID) | INTRAVENOUS | Status: DC | PRN
  Filled 2014-09-24: qty 4

## 2014-09-24 MED ORDER — SODIUM CHLORIDE 0.9 % IV SOLN: 6000.0000 mg | Freq: Once | INTRAVENOUS | Status: DC

## 2014-09-24 MED ORDER — SODIUM CHLORIDE 0.9 % IV SOLN
INTRAVENOUS | Status: AC
  Administered 2014-09-24: 11:00:00 via INTRAVENOUS
  Filled 2014-09-24: qty 1000

## 2014-09-24 MED ORDER — ONDANSETRON HCL 4 MG PO TABS: 4.0000 mg | ORAL_TABLET | Freq: Three times a day (TID) | ORAL | Status: DC | PRN

## 2014-09-24 MED ORDER — SODIUM CHLORIDE 0.9 % IV SOLN
Freq: Once | INTRAVENOUS | Status: AC
  Administered 2014-09-24: 21:00:00 via INTRAVENOUS

## 2014-09-24 NOTE — Progress Notes (Signed)
[place] @ [facility] Telephone:(336) [phone]  Fax:(336) [place] OB: 1955/11/23  MR#: [id number]  SXJ#:[id number]  Patient Care Team: Nicoletta Dress, MD as PCP - General (Internal Medicine) Christene Lye, MD (General Surgery) Margarita Rana, MD as Referring Physician (Family Medicine)  CHIEF COMPLAINT:  Chief Complaint  Patient presents with  . Follow-up    Oncology History   1.abnormal PET scan and CT scan Abdominal mass biopsy on 11th of January is consistent with diffuse large cell lymphoma B-cell type Fish panel was negative for any double hit  lymphoma 2.LDH is 276 3.Started on chemotherapy with R CHOP allopurinol was started few days before (May 14, 2014) patient received high-dose methotrexate after cycle 2 4, PET scan has been reviewed (April,18 th 2016) reported to be having excellent respons     Diffuse large B cell lymphoma   09/05/2014 Initial Diagnosis Diffuse large B cell lymphoma    Oncology Flowsheet 09/08/2014 09/24/2014  Day, Cycle Day 1, 6 -  cyclophosphamide (CYTOXAN) IV 1,300 mg -  dexamethasone (DECADRON) IV [ 20 mg ] 10 mg  diphenhydrAMINE (BENADRYL) PO 50 mg -  DOXOrubicin (ADRIAMYCIN) IV 50 mg/m2 -  methotrexate (PF) IV - 6,000 mg  ondansetron (ZOFRAN) IV [ 16 mg ] 12 mg  pegfilgrastim (NEULASTA ONPRO KIT) Page 6 mg -  riTUXimab (RITUXAN) IV 375 mg/m2 -  vinCRIStine (ONCOVIN) IV 2 mg -    INTERVAL HISTORY: 59 year old lady with diffuse large B-cell lymphoma here to initiate 6 cycle of chemotherapy.  Patient has tolerated treatment very well.  Has occasional abdominal discomfort.  No nausea no vomiting.  No soreness in the mouth.  No tingling numbness.  Patient is also due for high-dose methotrexate third and the last treatment.  REVIEW OF SYSTEMS:   GENERAL:  Feels good.  Active.  No fevers, sweats or weight loss. PERFORMANCE STATUS (ECOG):  0 HEENT:  No visual changes, runny nose, sore throat, mouth sores or  tenderness. Lungs: No shortness of breath or cough.  No hemoptysis. Cardiac:  No chest pain, palpitations, orthopnea, or PND. GI:  No nausea, vomiting, diarrhea, constipation, melena or hematochezia. GU:  No urgency, frequency, dysuria, or hematuria. Musculoskeletal:  No back pain.  No joint pain.  No muscle tenderness. Extremities:  No pain or swelling. Skin:  No rashes or skin changes. Neuro:  No headache, numbness or weakness, balance or coordination issues. Endocrine:  No diabetes, thyroid issues, hot flashes or night sweats. Psych:  No mood changes, depression or anxiety. Pain:  No focal pain. Review of systems:  All other systems reviewed and found to be negative.  As per HPI. Otherwise, a complete review of systems is negatve.  PAST MEDICAL HISTORY: Past Medical History  Diagnosis Date  . GERD (gastroesophageal reflux disease)   . 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 Anemia   . Fatty liver unknown  . 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 Upper gi endoscopy  2004    Dr Vira Agar  . Colonoscopy  2012    Dr. Vira Agar    FAMILY HISTORY Family History  Problem Relation Age of Onset  . Cancer Father     bone/brain    GYNECOLOGIC HISTORY:  No LMP recorded. Patient is postmenopausal.     ADVANCED DIRECTIVES: Does not have any advance care directive   HEALTH MAINTENANCE: History  Substance Use Topics  .  Smoking status: Never Smoker   . Smokeless tobacco: Not on file  . Alcohol Use: No     Comment: 4/week     Allergies  Allergen Reactions  . Pollen Extract Itching    No current facility-administered medications for this visit.   No current outpatient prescriptions on file.   Facility-Administered Medications Ordered in Other Visits  Medication Dose Route Frequency Provider Last Rate Last Dose  . acetaminophen (TYLENOL) tablet 650 mg  650 mg Oral Q4H PRN Evlyn Kanner, NP      . Derrill Memo ON 09/25/2014] leucovorin (WELLCOVORIN) 26 mg in dextrose 5 % 100 mL injection   Intravenous Q6H Evlyn Kanner, NP      . methotrexate (PF) 6,000 mg in sodium chloride 0.9 % 1,000 mL chemo infusion  6,000 mg Intravenous Once Forest Gleason, MD 310 mL/hr at 09/24/14 1455 6,000 mg at 09/24/14 1455  . ondansetron (ZOFRAN) tablet 4-8 mg  4-8 mg Oral Q8H PRN Evlyn Kanner, NP       Or  . ondansetron (ZOFRAN-ODT) disintegrating tablet 4-8 mg  4-8 mg Oral Q8H PRN Evlyn Kanner, NP       Or  . ondansetron (ZOFRAN) injection 4 mg  4 mg Intravenous Q8H PRN Evlyn Kanner, NP       Or  . ondansetron (ZOFRAN) 8 mg in sodium chloride 0.9 % 50 mL IVPB  8 mg Intravenous Q8H PRN Evlyn Kanner, NP      . senna-docusate (Senokot-S) tablet 1 tablet  1 tablet Oral QHS PRN Evlyn Kanner, NP      . sodium bicarbonate tablet 1,300 mg  1,300 mg Oral Q2H PRN Evlyn Kanner, NP      . sodium chloride 0.225 % with sodium bicarbonate 100 mEq infusion   Intravenous Continuous Evlyn Kanner, NP 150 mL/hr at 09/24/14 1555      OBJECTIVE:  Filed Vitals:   09/23/14 0927  BP: 120/81  Pulse: 70  Temp: 98 F (36.7 C)  Resp: 18     Body mass index is 26.41 kg/(m^2).    ECOG FS:0 - Asymptomatic  PHYSICAL EXAM: General  status: Performance status is good.  Patient has not lost significant weight HEENT: No evidence of stomatitis.  And alopecia Sclera and conjunctivae :: No jaundice.   pale looking . Lungs: Air  entry equal on both sides.  No rhonchi.  No rales.  Cardiac: Heart sounds are normal.  No pericardial rub.  No murmur. Lymphatic system: Cervical, axillary, inguinal, lymph nodes not palpable GI: Abdomen is soft.  No ascites.  Liver spleen not palpable.  No tenderness.  Bowel sounds are within normal limit Lower extremity: No edema Neurological system: Higher functions, cranial nerves intact No evidence of peripheral neuropathy. Skin: No rash.  No  ecchymosis.Marland Kitchen     LAB RESULTS:  Appointment on 09/23/2014  Component Date Value Ref Range Status  . WBC 09/23/2014 9.1  3.6 - 11.0 K/uL Final  . RBC 09/23/2014 3.87  3.80 - 5.20 MIL/uL Final  . Hemoglobin 09/23/2014 11.6* 12.0 - 16.0 g/dL Final  . HCT 09/23/2014 34.9* 35.0 - 47.0 % Final  . MCV 09/23/2014 90.2  80.0 - 100.0 fL Final  . MCH 09/23/2014 30.0  26.0 - 34.0 pg Final  . MCHC 09/23/2014 33.3  32.0 - 36.0 g/dL Final  . RDW 09/23/2014 16.6* 11.5 - 14.5 % Final  . Platelets 09/23/2014 228  150 - 440 K/uL Final  .  Neutrophils Relative % 09/23/2014 84%   Final  . Neutro Abs 09/23/2014 7.7* 1.4 - 6.5 K/uL Final  . Lymphocytes Relative 09/23/2014 6%   Final  . Lymphs Abs 09/23/2014 0.6* 1.0 - 3.6 K/uL Final  . Monocytes Relative 09/23/2014 8%   Final  . Monocytes Absolute 09/23/2014 0.7  0.2 - 0.9 K/uL Final  . Eosinophils Relative 09/23/2014 1%   Final  . Eosinophils Absolute 09/23/2014 0.1  0 - 0.7 K/uL Final  . Basophils Relative 09/23/2014 1%   Final  . Basophils Absolute 09/23/2014 0.0  0 - 0.1 K/uL Final  . Sodium 09/23/2014 138  135 - 145 mmol/L Final  . Potassium 09/23/2014 3.7  3.5 - 5.1 mmol/L Final  . Chloride 09/23/2014 105  101 - 111 mmol/L Final  . CO2 09/23/2014 28  22 - 32 mmol/L Final  . Glucose, Bld 09/23/2014 93  65 - 99 mg/dL Final  . BUN 09/23/2014 18  6 - 20 mg/dL Final  . Creatinine, Ser 09/23/2014 0.53  0.44 - 1.00 mg/dL Final  . Calcium 09/23/2014 8.9  8.9 - 10.3 mg/dL Final  . Total Protein 09/23/2014 6.2* 6.5 - 8.1 g/dL Final  . Albumin 09/23/2014 3.7  3.5 - 5.0 g/dL Final  . AST 09/23/2014 18  15 - 41 U/L Final  . ALT 09/23/2014 11* 14 - 54 U/L Final  . Alkaline Phosphatase 09/23/2014 81  38 - 126 U/L Final  . Total Bilirubin 09/23/2014 0.5  0.3 - 1.2 mg/dL Final  . GFR calc non Af Amer 09/23/2014 >60  >60 mL/min Final  . GFR calc Af Amer 09/23/2014 >60  >60 mL/min Final   Comment: (NOTE) The eGFR has been calculated using the CKD EPI  equation. This calculation has not been validated in all clinical situations. eGFR's persistently <60 mL/min signify possible Chronic Kidney Disease.   . Anion gap 09/23/2014 5  5 - 15 Final    No results found for: LABCA2 No results found for: CA199 Lab Results  Component Value Date   CEA <0.3 04/16/2014   No results found for: PSA Lab Results  Component Value Date   CA125 47.5* 04/16/2014     STUDIES: No results found.  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  At present time patient is responding to the chemotherapy this is a 6 and the last chemotherapy. Patient would be admitted in the hospital for high-dose methotrexate.  MEDICAL DECISION MAKING:  All lab data has been reviewed Repeat PET scan after 6 cycle of chemotherapy High-dose methotrexate tomorrow Patient expressed understanding and was in agreement with this plan. She also understands that She can call clinic at any time with any questions, concerns, or complaints.    Diffuse large B cell lymphoma   Staging form: Lymphoid Neoplasms, AJCC 6th Edition     Clinical: Stage III - Marni Griffon, MD   09/24/2014 5:06 PM

## 2014-09-24 NOTE — Progress Notes (Signed)
[place] @ [facility] [phone]  [phone] OB: April 25, 1956  MR#: [id number]  QJJ#:[id number]  Patient Care Team: Nicoletta Dress, MD as PCP - General (Internal Medicine) Christene Lye, MD (General Surgery) Margarita Rana, MD as Referring Physician (Family Medicine)  CHIEF COMPLAINT:  No chief complaint on file.   Oncology History   1.abnormal PET scan and CT scan Abdominal mass biopsy on 11th of January is consistent with diffuse large cell lymphoma B-cell type Fish panel was negative for any double hit  lymphoma 2.LDH is 276 3.Started on chemotherapy with R [facility] allopurinol was started few days before (May 14, 2014) patient received high-dose methotrexate after cycle 2 4, PET scan has been reviewed (April,18 th 2016) reported to be having excellent respons     Diffuse large B cell lymphoma   09/05/2014 Initial Diagnosis Diffuse large B cell lymphoma    Oncology Flowsheet 09/08/2014 09/24/2014  Day, Cycle Day 1, 6 -  cyclophosphamide (CYTOXAN) IV 1,300 mg -  dexamethasone (DECADRON) IV [ 20 mg ] 10 mg  diphenhydrAMINE (BENADRYL) PO 50 mg -  DOXOrubicin (ADRIAMYCIN) IV 50 mg/m2 -  methotrexate (PF) IV - 6,000 mg  ondansetron (ZOFRAN) IV [ 16 mg ] 12 mg  pegfilgrastim (NEULASTA ONPRO KIT) [place] 6 mg -  riTUXimab (RITUXAN) IV 375 mg/m2 -  vinCRIStine (ONCOVIN) IV 2 mg -    INTERVAL HISTORY: 59 year old lady with diffuse large B-cell lymphoma here to initiate 6 cycle of chemotherapy.  Patient has tolerated treatment very well.  Has occasional abdominal discomfort.  No nausea no vomiting.  No soreness in the mouth.  No tingling numbness.  Patient is also due for high-dose methotrexate third and the last treatment. September 24, 2014 Patient was admitted in the hospital for high-dose methotrexate treatment.  Patient remains asymptomatic at present time REVIEW OF SYSTEMS:   GENERAL:  Feels good.  Active.  No fevers, sweats or weight  loss. PERFORMANCE STATUS (ECOG):  0 HEENT:  No visual changes, runny nose, sore throat, mouth sores or tenderness. Lungs: No shortness of breath or cough.  No hemoptysis. Cardiac:  No chest pain, palpitations, orthopnea, or PND. GI:  No nausea, vomiting, diarrhea, constipation, melena or hematochezia. GU:  No urgency, frequency, dysuria, or hematuria. Musculoskeletal:  No back pain.  No joint pain.  No muscle tenderness. Extremities:  No pain or swelling. Skin:  No rashes or skin changes. Neuro:  No headache, numbness or weakness, balance or coordination issues. Endocrine:  No diabetes, thyroid issues, hot flashes or night sweats. Psych:  No mood changes, depression or anxiety. Pain:  No focal pain. Review of systems:  All other systems reviewed and found to be negative.  As per HPI. Otherwise, a complete review of systems is negatve.  PAST MEDICAL HISTORY: Past Medical History  Diagnosis Date  . GERD (gastroesophageal reflux disease)   . 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 Anemia   . Fatty liver unknown  . 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 Upper gi endoscopy  2004    Dr Vira Agar  . Colonoscopy  2012    Dr. Vira Agar    FAMILY HISTORY Family History  Problem Relation Age of Onset  . Cancer Father     bone/brain    GYNECOLOGIC HISTORY:  No LMP recorded. Patient is postmenopausal.     ADVANCED DIRECTIVES: Does not have  any advance care directive   HEALTH MAINTENANCE: History  Substance Use Topics  . Smoking status: Never Smoker   . Smokeless tobacco: Not on file  . Alcohol Use: No     Comment: 4/week     Allergies  Allergen Reactions  . Pollen Extract Itching    Current Facility-Administered Medications  Medication Dose Route Frequency Provider Last Rate Last Dose  . acetaminophen (TYLENOL) tablet 650 mg  650 mg Oral Q4H PRN Evlyn Kanner, NP      . Derrill Memo ON  09/25/2014] leucovorin (WELLCOVORIN) 26 mg in dextrose 5 % 100 mL injection   Intravenous Q6H Evlyn Kanner, NP      . methotrexate (PF) 6,000 mg in sodium chloride 0.9 % 1,000 mL chemo infusion  6,000 mg Intravenous Once Forest Gleason, MD 310 mL/hr at 09/24/14 1455 6,000 mg at 09/24/14 1455  . ondansetron (ZOFRAN) tablet 4-8 mg  4-8 mg Oral Q8H PRN Evlyn Kanner, NP       Or  . ondansetron (ZOFRAN-ODT) disintegrating tablet 4-8 mg  4-8 mg Oral Q8H PRN Evlyn Kanner, NP       Or  . ondansetron (ZOFRAN) injection 4 mg  4 mg Intravenous Q8H PRN Evlyn Kanner, NP       Or  . ondansetron (ZOFRAN) 8 mg in sodium chloride 0.9 % 50 mL IVPB  8 mg Intravenous Q8H PRN Evlyn Kanner, NP      . senna-docusate (Senokot-S) tablet 1 tablet  1 tablet Oral QHS PRN Evlyn Kanner, NP      . sodium bicarbonate tablet 1,300 mg  1,300 mg Oral Q2H PRN Evlyn Kanner, NP      . sodium chloride 0.225 % with sodium bicarbonate 100 mEq infusion   Intravenous Continuous Evlyn Kanner, NP 150 mL/hr at 09/24/14 1555      OBJECTIVE:  Filed Vitals:   09/24/14 1319  BP: 113/71  Pulse: 85  Temp: 98.2 F (36.8 C)  Resp:      Body mass index is 26.81 kg/(m^2).    ECOG FS:0 - Asymptomatic  PHYSICAL EXAM: General  status: Performance status is good.  Patient has not lost significant weight HEENT: No evidence of stomatitis.  And alopecia Sclera and conjunctivae :: No jaundice.   pale looking . Lungs: Air  entry equal on both sides.  No rhonchi.  No rales.  Cardiac: Heart sounds are normal.  No pericardial rub.  No murmur. Lymphatic system: Cervical, axillary, inguinal, lymph nodes not palpable GI: Abdomen is soft.  No ascites.  Liver spleen not palpable.  No tenderness.  Bowel sounds are within normal limit Lower extremity: No edema Neurological system: Higher functions, cranial nerves intact No evidence of peripheral neuropathy. Skin: No rash.  No ecchymosis.Marland Kitchen     LAB RESULTS:  Admission on  09/24/2014  Component Date Value Ref Range Status  . pH 09/24/2014 5.0  5.0 - 8.0 Final  . pH 09/24/2014 6.0  5.0 - 8.0 Final  . pH 09/24/2014 7.0  5.0 - 8.0 Final  . pH 09/24/2014 9.0* 5.0 - 8.0 Final  Appointment on 09/23/2014  Component Date Value Ref Range Status  . WBC 09/23/2014 9.1  3.6 - 11.0 K/uL Final  . RBC 09/23/2014 3.87  3.80 - 5.20 MIL/uL Final  . Hemoglobin 09/23/2014 11.6* 12.0 - 16.0 g/dL Final  . HCT 09/23/2014 34.9* 35.0 - 47.0 % Final  . MCV 09/23/2014 90.2  80.0 - 100.0 fL Final  .  MCH 09/23/2014 30.0  26.0 - 34.0 pg Final  . MCHC 09/23/2014 33.3  32.0 - 36.0 g/dL Final  . RDW 09/23/2014 16.6* 11.5 - 14.5 % Final  . Platelets 09/23/2014 228  150 - 440 K/uL Final  . Neutrophils Relative % 09/23/2014 84%   Final  . Neutro Abs 09/23/2014 7.7* 1.4 - 6.5 K/uL Final  . Lymphocytes Relative 09/23/2014 6%   Final  . Lymphs Abs 09/23/2014 0.6* 1.0 - 3.6 K/uL Final  . Monocytes Relative 09/23/2014 8%   Final  . Monocytes Absolute 09/23/2014 0.7  0.2 - 0.9 K/uL Final  . Eosinophils Relative 09/23/2014 1%   Final  . Eosinophils Absolute 09/23/2014 0.1  0 - 0.7 K/uL Final  . Basophils Relative 09/23/2014 1%   Final  . Basophils Absolute 09/23/2014 0.0  0 - 0.1 K/uL Final  . Sodium 09/23/2014 138  135 - 145 mmol/L Final  . Potassium 09/23/2014 3.7  3.5 - 5.1 mmol/L Final  . Chloride 09/23/2014 105  101 - 111 mmol/L Final  . CO2 09/23/2014 28  22 - 32 mmol/L Final  . Glucose, Bld 09/23/2014 93  65 - 99 mg/dL Final  . BUN 09/23/2014 18  6 - 20 mg/dL Final  . Creatinine, Ser 09/23/2014 0.53  0.44 - 1.00 mg/dL Final  . Calcium 09/23/2014 8.9  8.9 - 10.3 mg/dL Final  . Total Protein 09/23/2014 6.2* 6.5 - 8.1 g/dL Final  . Albumin 09/23/2014 3.7  3.5 - 5.0 g/dL Final  . AST 09/23/2014 18  15 - 41 U/L Final  . ALT 09/23/2014 11* 14 - 54 U/L Final  . Alkaline Phosphatase 09/23/2014 81  38 - 126 U/L Final  . Total Bilirubin 09/23/2014 0.5  0.3 - 1.2 mg/dL Final  . GFR calc non  Af Amer 09/23/2014 >60  >60 mL/min Final  . GFR calc Af Amer 09/23/2014 >60  >60 mL/min Final   Comment: (NOTE) The eGFR has been calculated using the CKD EPI equation. This calculation has not been validated in all clinical situations. eGFR's persistently <60 mL/min signify possible Chronic Kidney Disease.   . Anion gap 09/23/2014 5  5 - 15 Final    No results found for: LABCA2 No results found for: CA199 Lab Results  Component Value Date   CEA <0.3 04/16/2014   No results found for: PSA Lab Results  Component Value Date   CA125 47.5* 04/16/2014     STUDIES: No results found.  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  At present time patient is responding to the chemotherapy this is a 6 and the last chemotherapy. She was admitted in the hospital for high-dose methotrexate  MEDICAL DECISION MAKING:  All lab data has been reviewed Repeat PET scan after 6 cycle of chemotherapy High-dose methotrexate in 2 weeks Patient was referred to bone marrow transplant team for consideration of consolidation therapy. Total duration of visit was 45 minutes.  50% or more time was spent in counseling patient and family regarding prognosis and options of treatment and available resources  Patient expressed understanding and was in agreement with this plan. She also understands that She can call clinic at any time with any questions, concerns, or complaints.    Diffuse large B cell lymphoma   Staging form: Lymphoid Neoplasms, AJCC 6th Edition     Clinical: Stage III - Marni Griffon, MD   09/24/2014 4:51 PM

## 2014-09-24 NOTE — Plan of Care (Signed)
Problem: Discharge Progression Outcomes Goal: Discharge plan in place and appropriate Individualization of Care Pt prefers to be called April Nolan Hx of GERD, Diffuse large B cell lymphoma, anemia and fatty liver on home medications and chemotherapy treatments Low Fall Risk

## 2014-09-24 NOTE — Progress Notes (Signed)
[place] @ [facility] [phone]  Fax:([phone]) [place] OB: April 25, 1956  MR#: [id number]  QJJ#:[id number]  Patient Care Team: Nicoletta Dress, MD as PCP - General (Internal Medicine) Christene Lye, MD (General Surgery) Margarita Rana, MD as Referring Physician (Family Medicine)  CHIEF COMPLAINT:  No chief complaint on file.   Oncology History   1.abnormal PET scan and CT scan Abdominal mass biopsy on 11th of January is consistent with diffuse large cell lymphoma B-cell type Fish panel was negative for any double hit  lymphoma 2.LDH is 276 3.Started on chemotherapy with R CHOP allopurinol was started few days before (May 14, 2014) patient received high-dose methotrexate after cycle 2 4, PET scan has been reviewed (April,18 th 2016) reported to be having excellent respons     Diffuse large B cell lymphoma   09/05/2014 Initial Diagnosis Diffuse large B cell lymphoma    Oncology Flowsheet 09/08/2014 09/24/2014  Day, Cycle Day 1, 6 -  cyclophosphamide (CYTOXAN) IV 1,300 mg -  dexamethasone (DECADRON) IV [ 20 mg ] 10 mg  diphenhydrAMINE (BENADRYL) PO 50 mg -  DOXOrubicin (ADRIAMYCIN) IV 50 mg/m2 -  methotrexate (PF) IV - 6,000 mg  ondansetron (ZOFRAN) IV [ 16 mg ] 12 mg  pegfilgrastim (NEULASTA ONPRO KIT) [place] 6 mg -  riTUXimab (RITUXAN) IV 375 mg/m2 -  vinCRIStine (ONCOVIN) IV 2 mg -    INTERVAL HISTORY: 59 year old lady with diffuse large B-cell lymphoma here to initiate 6 cycle of chemotherapy.  Patient has tolerated treatment very well.  Has occasional abdominal discomfort.  No nausea no vomiting.  No soreness in the mouth.  No tingling numbness.  Patient is also due for high-dose methotrexate third and the last treatment. September 24, 2014 Patient was admitted in the hospital for high-dose methotrexate treatment.  Patient remains asymptomatic at present time REVIEW OF SYSTEMS:   GENERAL:  Feels good.  Active.  No fevers, sweats or weight  loss. PERFORMANCE STATUS (ECOG):  0 HEENT:  No visual changes, runny nose, sore throat, mouth sores or tenderness. Lungs: No shortness of breath or cough.  No hemoptysis. Cardiac:  No chest pain, palpitations, orthopnea, or PND. GI:  No nausea, vomiting, diarrhea, constipation, melena or hematochezia. GU:  No urgency, frequency, dysuria, or hematuria. Musculoskeletal:  No back pain.  No joint pain.  No muscle tenderness. Extremities:  No pain or swelling. Skin:  No rashes or skin changes. Neuro:  No headache, numbness or weakness, balance or coordination issues. Endocrine:  No diabetes, thyroid issues, hot flashes or night sweats. Psych:  No mood changes, depression or anxiety. Pain:  No focal pain. Review of systems:  All other systems reviewed and found to be negative.  As per HPI. Otherwise, a complete review of systems is negatve.  PAST MEDICAL HISTORY: Past Medical History  Diagnosis Date  . GERD (gastroesophageal reflux disease)   . 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 Anemia   . Fatty liver unknown  . 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 Upper gi endoscopy  2004    Dr Vira Agar  . Colonoscopy  2012    Dr. Vira Agar    FAMILY HISTORY Family History  Problem Relation Age of Onset  . Cancer Father     bone/brain    GYNECOLOGIC HISTORY:  No LMP recorded. Patient is postmenopausal.     ADVANCED DIRECTIVES: Does not have  any advance care directive   HEALTH MAINTENANCE: History  Substance Use Topics  . Smoking status: Never Smoker   . Smokeless tobacco: Not on file  . Alcohol Use: No     Comment: 4/week     Allergies  Allergen Reactions  . Pollen Extract Itching    Current Facility-Administered Medications  Medication Dose Route Frequency Provider Last Rate Last Dose  . acetaminophen (TYLENOL) tablet 650 mg  650 mg Oral Q4H PRN Evlyn Kanner, NP      . Derrill Memo ON  09/25/2014] leucovorin (WELLCOVORIN) 26 mg in dextrose 5 % 100 mL injection   Intravenous Q6H Evlyn Kanner, NP      . methotrexate (PF) 6,000 mg in sodium chloride 0.9 % 1,000 mL chemo infusion  6,000 mg Intravenous Once Forest Gleason, MD 310 mL/hr at 09/24/14 1455 6,000 mg at 09/24/14 1455  . ondansetron (ZOFRAN) tablet 4-8 mg  4-8 mg Oral Q8H PRN Evlyn Kanner, NP       Or  . ondansetron (ZOFRAN-ODT) disintegrating tablet 4-8 mg  4-8 mg Oral Q8H PRN Evlyn Kanner, NP       Or  . ondansetron (ZOFRAN) injection 4 mg  4 mg Intravenous Q8H PRN Evlyn Kanner, NP       Or  . ondansetron (ZOFRAN) 8 mg in sodium chloride 0.9 % 50 mL IVPB  8 mg Intravenous Q8H PRN Evlyn Kanner, NP      . senna-docusate (Senokot-S) tablet 1 tablet  1 tablet Oral QHS PRN Evlyn Kanner, NP      . sodium bicarbonate tablet 1,300 mg  1,300 mg Oral Q2H PRN Evlyn Kanner, NP      . sodium chloride 0.225 % with sodium bicarbonate 100 mEq infusion   Intravenous Continuous Evlyn Kanner, NP 150 mL/hr at 09/24/14 1555      OBJECTIVE:  Filed Vitals:   09/24/14 1319  BP: 113/71  Pulse: 85  Temp: 98.2 F (36.8 C)  Resp:      Body mass index is 26.81 kg/(m^2).    ECOG FS:0 - Asymptomatic  PHYSICAL EXAM: General  status: Performance status is good.  Patient has not lost significant weight HEENT: No evidence of stomatitis.  And alopecia Sclera and conjunctivae :: No jaundice.   pale looking . Lungs: Air  entry equal on both sides.  No rhonchi.  No rales.  Cardiac: Heart sounds are normal.  No pericardial rub.  No murmur. Lymphatic system: Cervical, axillary, inguinal, lymph nodes not palpable GI: Abdomen is soft.  No ascites.  Liver spleen not palpable.  No tenderness.  Bowel sounds are within normal limit Lower extremity: No edema Neurological system: Higher functions, cranial nerves intact No evidence of peripheral neuropathy. Skin: No rash.  No ecchymosis.Marland Kitchen     LAB RESULTS:  Admission on  09/24/2014  Component Date Value Ref Range Status  . pH 09/24/2014 5.0  5.0 - 8.0 Final  . pH 09/24/2014 6.0  5.0 - 8.0 Final  . pH 09/24/2014 7.0  5.0 - 8.0 Final  . pH 09/24/2014 9.0* 5.0 - 8.0 Final  Appointment on 09/23/2014  Component Date Value Ref Range Status  . WBC 09/23/2014 9.1  3.6 - 11.0 K/uL Final  . RBC 09/23/2014 3.87  3.80 - 5.20 MIL/uL Final  . Hemoglobin 09/23/2014 11.6* 12.0 - 16.0 g/dL Final  . HCT 09/23/2014 34.9* 35.0 - 47.0 % Final  . MCV 09/23/2014 90.2  80.0 - 100.0 fL Final  .  MCH 09/23/2014 30.0  26.0 - 34.0 pg Final  . MCHC 09/23/2014 33.3  32.0 - 36.0 g/dL Final  . RDW 09/23/2014 16.6* 11.5 - 14.5 % Final  . Platelets 09/23/2014 228  150 - 440 K/uL Final  . Neutrophils Relative % 09/23/2014 84%   Final  . Neutro Abs 09/23/2014 7.7* 1.4 - 6.5 K/uL Final  . Lymphocytes Relative 09/23/2014 6%   Final  . Lymphs Abs 09/23/2014 0.6* 1.0 - 3.6 K/uL Final  . Monocytes Relative 09/23/2014 8%   Final  . Monocytes Absolute 09/23/2014 0.7  0.2 - 0.9 K/uL Final  . Eosinophils Relative 09/23/2014 1%   Final  . Eosinophils Absolute 09/23/2014 0.1  0 - 0.7 K/uL Final  . Basophils Relative 09/23/2014 1%   Final  . Basophils Absolute 09/23/2014 0.0  0 - 0.1 K/uL Final  . Sodium 09/23/2014 138  135 - 145 mmol/L Final  . Potassium 09/23/2014 3.7  3.5 - 5.1 mmol/L Final  . Chloride 09/23/2014 105  101 - 111 mmol/L Final  . CO2 09/23/2014 28  22 - 32 mmol/L Final  . Glucose, Bld 09/23/2014 93  65 - 99 mg/dL Final  . BUN 09/23/2014 18  6 - 20 mg/dL Final  . Creatinine, Ser 09/23/2014 0.53  0.44 - 1.00 mg/dL Final  . Calcium 09/23/2014 8.9  8.9 - 10.3 mg/dL Final  . Total Protein 09/23/2014 6.2* 6.5 - 8.1 g/dL Final  . Albumin 09/23/2014 3.7  3.5 - 5.0 g/dL Final  . AST 09/23/2014 18  15 - 41 U/L Final  . ALT 09/23/2014 11* 14 - 54 U/L Final  . Alkaline Phosphatase 09/23/2014 81  38 - 126 U/L Final  . Total Bilirubin 09/23/2014 0.5  0.3 - 1.2 mg/dL Final  . GFR calc non  Af Amer 09/23/2014 >60  >60 mL/min Final  . GFR calc Af Amer 09/23/2014 >60  >60 mL/min Final   Comment: (NOTE) The eGFR has been calculated using the CKD EPI equation. This calculation has not been validated in all clinical situations. eGFR's persistently <60 mL/min signify possible Chronic Kidney Disease.   . Anion gap 09/23/2014 5  5 - 15 Final    No results found for: LABCA2 No results found for: CA199 Lab Results  Component Value Date   CEA <0.3 04/16/2014   No results found for: PSA Lab Results  Component Value Date   CA125 47.5* 04/16/2014     STUDIES: No results found.  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  At present time patient is responding to the chemotherapy this is a 6 and the last chemotherapy. She was admitted in the hospital for high-dose methotrexate  MEDICAL DECISION MAKING:  All lab data has been reviewed Repeat PET scan after 6 cycle of chemotherapy High-dose methotrexate in 2 weeks Patient was referred to bone marrow transplant team for consideration of consolidation therapy. Total duration of visit was 45 minutes.  50% or more time was spent in counseling patient and family regarding prognosis and options of treatment and available resources  Patient expressed understanding and was in agreement with this plan. She also understands that She can call clinic at any time with any questions, concerns, or complaints.    Diffuse large B cell lymphoma   Staging form: Lymphoid Neoplasms, AJCC 6th Edition     Clinical: Stage III - April Griffon, MD   09/24/2014 4:52 PM   [place] @ [facility] Telephone:(336) [phone]  Fax:(336) [phone]  April Nolan OB: [birth year]  MR#: [id number]  QQI#:[id number]  Patient Care Team: Nicoletta Dress, MD as PCP - General (Internal Medicine) Christene Lye, MD (General Surgery) Margarita Rana, MD as Referring Physician (Family Medicine)  CHIEF COMPLAINT:  No chief complaint on  file.   Oncology History   1.abnormal PET scan and CT scan Abdominal mass biopsy on 11th of January is consistent with diffuse large cell lymphoma B-cell type Fish panel was negative for any double hit  lymphoma 2.LDH is 276 3.Started on chemotherapy with R CHOP allopurinol was started few days before (May 14, 2014) patient received high-dose methotrexate after cycle 2 4, PET scan has been reviewed (April,18 th 2016) reported to be having excellent respons     Diffuse large B cell lymphoma   09/05/2014 Initial Diagnosis Diffuse large B cell lymphoma    Oncology Flowsheet 09/08/2014 09/24/2014  Day, Cycle Day 1, 6 -  cyclophosphamide (CYTOXAN) IV 1,300 mg -  dexamethasone (DECADRON) IV [ 20 mg ] 10 mg  diphenhydrAMINE (BENADRYL) PO 50 mg -  DOXOrubicin (ADRIAMYCIN) IV 50 mg/m2 -  methotrexate (PF) IV - 6,000 mg  ondansetron (ZOFRAN) IV [ 16 mg ] 12 mg  pegfilgrastim (NEULASTA ONPRO KIT) [place] 6 mg -  riTUXimab (RITUXAN) IV 375 mg/m2 -  vinCRIStine (ONCOVIN) IV 2 mg -    INTERVAL HISTORY: 59 year old lady with diffuse large B-cell lymphoma here to initiate 6 cycle of chemotherapy.  Patient has tolerated treatment very well.  Has occasional abdominal discomfort.  No nausea no vomiting.  No soreness in the mouth.  No tingling numbness.  Patient is also due for high-dose methotrexate third and the last treatment. September 24, 2014 Patient was admitted in the hospital for high-dose methotrexate treatment.  Patient remains asymptomatic at present time REVIEW OF SYSTEMS:   GENERAL:  Feels good.  Active.  No fevers, sweats or weight loss. PERFORMANCE STATUS (ECOG):  0 HEENT:  No visual changes, runny nose, sore throat, mouth sores or tenderness. Lungs: No shortness of breath or cough.  No hemoptysis. Cardiac:  No chest pain, palpitations, orthopnea, or PND. GI:  No nausea, vomiting, diarrhea, constipation, melena or hematochezia. GU:  No urgency, frequency, dysuria, or  hematuria. Musculoskeletal:  No back pain.  No joint pain.  No muscle tenderness. Extremities:  No pain or swelling. Skin:  No rashes or skin changes. Neuro:  No headache, numbness or weakness, balance or coordination issues. Endocrine:  No diabetes, thyroid issues, hot flashes or night sweats. Psych:  No mood changes, depression or anxiety. Pain:  No focal pain. Review of systems:  All other systems reviewed and found to be negative.  As per HPI. Otherwise, a complete review of systems is negatve.  PAST MEDICAL HISTORY: Past Medical History  Diagnosis Date  . GERD (gastroesophageal reflux disease)   . 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 Anemia   . Fatty liver unknown  . 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 Upper gi endoscopy  2004    Dr Vira Agar  . Colonoscopy  2012    Dr. Vira Agar    FAMILY HISTORY Family History  Problem Relation Age of Onset  . Cancer Father     bone/brain    GYNECOLOGIC HISTORY:  No LMP recorded. Patient is postmenopausal.     ADVANCED DIRECTIVES: Does not have any advance care directive   HEALTH MAINTENANCE: History  Substance Use  Topics  . Smoking status: Never Smoker   . Smokeless tobacco: Not on file  . Alcohol Use: No     Comment: 4/week     Allergies  Allergen Reactions  . Pollen Extract Itching    Current Facility-Administered Medications  Medication Dose Route Frequency Provider Last Rate Last Dose  . acetaminophen (TYLENOL) tablet 650 mg  650 mg Oral Q4H PRN Evlyn Kanner, NP      . Derrill Memo ON 09/25/2014] leucovorin (WELLCOVORIN) 26 mg in dextrose 5 % 100 mL injection   Intravenous Q6H Evlyn Kanner, NP      . methotrexate (PF) 6,000 mg in sodium chloride 0.9 % 1,000 mL chemo infusion  6,000 mg Intravenous Once Forest Gleason, MD 310 mL/hr at 09/24/14 1455 6,000 mg at 09/24/14 1455  . ondansetron (ZOFRAN) tablet 4-8 mg  4-8 mg Oral Q8H  PRN Evlyn Kanner, NP       Or  . ondansetron (ZOFRAN-ODT) disintegrating tablet 4-8 mg  4-8 mg Oral Q8H PRN Evlyn Kanner, NP       Or  . ondansetron (ZOFRAN) injection 4 mg  4 mg Intravenous Q8H PRN Evlyn Kanner, NP       Or  . ondansetron (ZOFRAN) 8 mg in sodium chloride 0.9 % 50 mL IVPB  8 mg Intravenous Q8H PRN Evlyn Kanner, NP      . senna-docusate (Senokot-S) tablet 1 tablet  1 tablet Oral QHS PRN Evlyn Kanner, NP      . sodium bicarbonate tablet 1,300 mg  1,300 mg Oral Q2H PRN Evlyn Kanner, NP      . sodium chloride 0.225 % with sodium bicarbonate 100 mEq infusion   Intravenous Continuous Evlyn Kanner, NP 150 mL/hr at 09/24/14 1555      OBJECTIVE:  Filed Vitals:   09/24/14 1319  BP: 113/71  Pulse: 85  Temp: 98.2 F (36.8 C)  Resp:      Body mass index is 26.81 kg/(m^2).    ECOG FS:0 - Asymptomatic  PHYSICAL EXAM: General  status: Performance status is good.  Patient has not lost significant weight HEENT: No evidence of stomatitis.  And alopecia Sclera and conjunctivae :: No jaundice.   pale looking . Lungs: Air  entry equal on both sides.  No rhonchi.  No rales.  Cardiac: Heart sounds are normal.  No pericardial rub.  No murmur. Lymphatic system: Cervical, axillary, inguinal, lymph nodes not palpable GI: Abdomen is soft.  No ascites.  Liver spleen not palpable.  No tenderness.  Bowel sounds are within normal limit Lower extremity: No edema Neurological system: Higher functions, cranial nerves intact No evidence of peripheral neuropathy. Skin: No rash.  No ecchymosis.Marland Kitchen     LAB RESULTS:  Admission on 09/24/2014  Component Date Value Ref Range Status  . pH 09/24/2014 5.0  5.0 - 8.0 Final  . pH 09/24/2014 6.0  5.0 - 8.0 Final  . pH 09/24/2014 7.0  5.0 - 8.0 Final  . pH 09/24/2014 9.0* 5.0 - 8.0 Final  Appointment on 09/23/2014  Component Date Value Ref Range Status  . WBC 09/23/2014 9.1  3.6 - 11.0 K/uL Final  . RBC 09/23/2014 3.87   3.80 - 5.20 MIL/uL Final  . Hemoglobin 09/23/2014 11.6* 12.0 - 16.0 g/dL Final  . HCT 09/23/2014 34.9* 35.0 - 47.0 % Final  . MCV 09/23/2014 90.2  80.0 - 100.0 fL Final  . MCH 09/23/2014 30.0  26.0 - 34.0 pg Final  .  MCHC 09/23/2014 33.3  32.0 - 36.0 g/dL Final  . RDW 09/23/2014 16.6* 11.5 - 14.5 % Final  . Platelets 09/23/2014 228  150 - 440 K/uL Final  . Neutrophils Relative % 09/23/2014 84%   Final  . Neutro Abs 09/23/2014 7.7* 1.4 - 6.5 K/uL Final  . Lymphocytes Relative 09/23/2014 6%   Final  . Lymphs Abs 09/23/2014 0.6* 1.0 - 3.6 K/uL Final  . Monocytes Relative 09/23/2014 8%   Final  . Monocytes Absolute 09/23/2014 0.7  0.2 - 0.9 K/uL Final  . Eosinophils Relative 09/23/2014 1%   Final  . Eosinophils Absolute 09/23/2014 0.1  0 - 0.7 K/uL Final  . Basophils Relative 09/23/2014 1%   Final  . Basophils Absolute 09/23/2014 0.0  0 - 0.1 K/uL Final  . Sodium 09/23/2014 138  135 - 145 mmol/L Final  . Potassium 09/23/2014 3.7  3.5 - 5.1 mmol/L Final  . Chloride 09/23/2014 105  101 - 111 mmol/L Final  . CO2 09/23/2014 28  22 - 32 mmol/L Final  . Glucose, Bld 09/23/2014 93  65 - 99 mg/dL Final  . BUN 09/23/2014 18  6 - 20 mg/dL Final  . Creatinine, Ser 09/23/2014 0.53  0.44 - 1.00 mg/dL Final  . Calcium 09/23/2014 8.9  8.9 - 10.3 mg/dL Final  . Total Protein 09/23/2014 6.2* 6.5 - 8.1 g/dL Final  . Albumin 09/23/2014 3.7  3.5 - 5.0 g/dL Final  . AST 09/23/2014 18  15 - 41 U/L Final  . ALT 09/23/2014 11* 14 - 54 U/L Final  . Alkaline Phosphatase 09/23/2014 81  38 - 126 U/L Final  . Total Bilirubin 09/23/2014 0.5  0.3 - 1.2 mg/dL Final  . GFR calc non Af Amer 09/23/2014 >60  >60 mL/min Final  . GFR calc Af Amer 09/23/2014 >60  >60 mL/min Final   Comment: (NOTE) The eGFR has been calculated using the CKD EPI equation. This calculation has not been validated in all clinical situations. eGFR's persistently <60 mL/min signify possible Chronic Kidney Disease.   . Anion gap 09/23/2014 5   5 - 15 Final    No results found for: LABCA2 No results found for: CA199 Lab Results  Component Value Date   CEA <0.3 04/16/2014   No results found for: PSA Lab Results  Component Value Date   CA125 47.5* 04/16/2014     STUDIES: No results found.  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  At present time patient is responding to the chemotherapy this is a 6 and the last chemotherapy. She was admitted in the hospital for high-dose methotrexate  MEDICAL DECISION MAKING:  All lab data has been reviewed Repeat PET scan after 6 cycle of chemotherapy High-dose methotrexate in 2 weeks Patient was referred to bone marrow transplant team for consideration of consolidation therapy. Total duration of visit was 45 minutes.  50% or more time was spent in counseling patient and family regarding prognosis and options of treatment and available resources  Patient expressed understanding and was in agreement with this plan. She also understands that She can call clinic at any time with any questions, concerns, or complaints.    Diffuse large B cell lymphoma   Staging form: Lymphoid Neoplasms, AJCC 6th Edition     Clinical: Stage III - April Griffon, MD   09/24/2014 4:52 PM

## 2014-09-24 NOTE — Progress Notes (Signed)
[place] @ [facility] [phone]  Fax:(336) [place] OB: April 25, 1956  MR#: [id number]  QJJ#:[id number]  Patient Care Team: Nicoletta Dress, MD as PCP - General (Internal Medicine) Christene Lye, MD (General Surgery) Margarita Rana, MD as Referring Physician (Family Medicine)  CHIEF COMPLAINT:  No chief complaint on file.   Oncology History   1.abnormal PET scan and CT scan Abdominal mass biopsy on 11th of January is consistent with diffuse large cell lymphoma B-cell type Fish panel was negative for any double hit  lymphoma 2.LDH is 276 3.Started on chemotherapy with R CHOP allopurinol was started few days before (May 14, 2014) patient received high-dose methotrexate after cycle 2 4, PET scan has been reviewed (April,18 th 2016) reported to be having excellent respons     Diffuse large B cell lymphoma   09/05/2014 Initial Diagnosis Diffuse large B cell lymphoma    Oncology Flowsheet 09/08/2014 09/24/2014  Day, Cycle Day 1, 6 -  cyclophosphamide (CYTOXAN) IV 1,300 mg -  dexamethasone (DECADRON) IV [ 20 mg ] 10 mg  diphenhydrAMINE (BENADRYL) PO 50 mg -  DOXOrubicin (ADRIAMYCIN) IV 50 mg/m2 -  methotrexate (PF) IV - 6,000 mg  ondansetron (ZOFRAN) IV [ 16 mg ] 12 mg  pegfilgrastim (NEULASTA ONPRO KIT) [place] 6 mg -  riTUXimab (RITUXAN) IV 375 mg/m2 -  vinCRIStine (ONCOVIN) IV 2 mg -    INTERVAL HISTORY: 59 year old lady with diffuse large B-cell lymphoma here to initiate 6 cycle of chemotherapy.  Patient has tolerated treatment very well.  Has occasional abdominal discomfort.  No nausea no vomiting.  No soreness in the mouth.  No tingling numbness.  Patient is also due for high-dose methotrexate third and the last treatment. September 24, 2014 Patient was admitted in the hospital for high-dose methotrexate treatment.  Patient remains asymptomatic at present time REVIEW OF SYSTEMS:   GENERAL:  Feels good.  Active.  No fevers, sweats or weight  loss. PERFORMANCE STATUS (ECOG):  0 HEENT:  No visual changes, runny nose, sore throat, mouth sores or tenderness. Lungs: No shortness of breath or cough.  No hemoptysis. Cardiac:  No chest pain, palpitations, orthopnea, or PND. GI:  No nausea, vomiting, diarrhea, constipation, melena or hematochezia. GU:  No urgency, frequency, dysuria, or hematuria. Musculoskeletal:  No back pain.  No joint pain.  No muscle tenderness. Extremities:  No pain or swelling. Skin:  No rashes or skin changes. Neuro:  No headache, numbness or weakness, balance or coordination issues. Endocrine:  No diabetes, thyroid issues, hot flashes or night sweats. Psych:  No mood changes, depression or anxiety. Pain:  No focal pain. Review of systems:  All other systems reviewed and found to be negative.  As per HPI. Otherwise, a complete review of systems is negatve.  PAST MEDICAL HISTORY: Past Medical History  Diagnosis Date  . GERD (gastroesophageal reflux disease)   . 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 Anemia   . Fatty liver unknown  . 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 Upper gi endoscopy  2004    Dr Vira Agar  . Colonoscopy  2012    Dr. Vira Agar    FAMILY HISTORY Family History  Problem Relation Age of Onset  . Cancer Father     bone/brain    GYNECOLOGIC HISTORY:  No LMP recorded. Patient is postmenopausal.     ADVANCED DIRECTIVES: Does not have  any advance care directive   HEALTH MAINTENANCE: History  Substance Use Topics  . Smoking status: Never Smoker   . Smokeless tobacco: Not on file  . Alcohol Use: No     Comment: 4/week     Allergies  Allergen Reactions  . Pollen Extract Itching    Current Facility-Administered Medications  Medication Dose Route Frequency Provider Last Rate Last Dose  . acetaminophen (TYLENOL) tablet 650 mg  650 mg Oral Q4H PRN Evlyn Kanner, NP      . Derrill Memo ON  09/25/2014] leucovorin (WELLCOVORIN) 26 mg in dextrose 5 % 100 mL injection   Intravenous Q6H Evlyn Kanner, NP      . methotrexate (PF) 6,000 mg in sodium chloride 0.9 % 1,000 mL chemo infusion  6,000 mg Intravenous Once Forest Gleason, MD 310 mL/hr at 09/24/14 1455 6,000 mg at 09/24/14 1455  . ondansetron (ZOFRAN) tablet 4-8 mg  4-8 mg Oral Q8H PRN Evlyn Kanner, NP       Or  . ondansetron (ZOFRAN-ODT) disintegrating tablet 4-8 mg  4-8 mg Oral Q8H PRN Evlyn Kanner, NP       Or  . ondansetron (ZOFRAN) injection 4 mg  4 mg Intravenous Q8H PRN Evlyn Kanner, NP       Or  . ondansetron (ZOFRAN) 8 mg in sodium chloride 0.9 % 50 mL IVPB  8 mg Intravenous Q8H PRN Evlyn Kanner, NP      . senna-docusate (Senokot-S) tablet 1 tablet  1 tablet Oral QHS PRN Evlyn Kanner, NP      . sodium bicarbonate tablet 1,300 mg  1,300 mg Oral Q2H PRN Evlyn Kanner, NP      . sodium chloride 0.225 % with sodium bicarbonate 100 mEq infusion   Intravenous Continuous Evlyn Kanner, NP 150 mL/hr at 09/24/14 1555      OBJECTIVE:  Filed Vitals:   09/24/14 1319  BP: 113/71  Pulse: 85  Temp: 98.2 F (36.8 C)  Resp:      Body mass index is 26.81 kg/(m^2).    ECOG FS:0 - Asymptomatic  PHYSICAL EXAM: General  status: Performance status is good.  Patient has not lost significant weight HEENT: No evidence of stomatitis.  And alopecia Sclera and conjunctivae :: No jaundice.   pale looking . Lungs: Air  entry equal on both sides.  No rhonchi.  No rales.  Cardiac: Heart sounds are normal.  No pericardial rub.  No murmur. Lymphatic system: Cervical, axillary, inguinal, lymph nodes not palpable GI: Abdomen is soft.  No ascites.  Liver spleen not palpable.  No tenderness.  Bowel sounds are within normal limit Lower extremity: No edema Neurological system: Higher functions, cranial nerves intact No evidence of peripheral neuropathy. Skin: No rash.  No ecchymosis.Marland Kitchen     LAB RESULTS:  Admission on  09/24/2014  Component Date Value Ref Range Status  . pH 09/24/2014 5.0  5.0 - 8.0 Final  . pH 09/24/2014 6.0  5.0 - 8.0 Final  . pH 09/24/2014 7.0  5.0 - 8.0 Final  . pH 09/24/2014 9.0* 5.0 - 8.0 Final  Appointment on 09/23/2014  Component Date Value Ref Range Status  . WBC 09/23/2014 9.1  3.6 - 11.0 K/uL Final  . RBC 09/23/2014 3.87  3.80 - 5.20 MIL/uL Final  . Hemoglobin 09/23/2014 11.6* 12.0 - 16.0 g/dL Final  . HCT 09/23/2014 34.9* 35.0 - 47.0 % Final  . MCV 09/23/2014 90.2  80.0 - 100.0 fL Final  .  MCH 09/23/2014 30.0  26.0 - 34.0 pg Final  . MCHC 09/23/2014 33.3  32.0 - 36.0 g/dL Final  . RDW 09/23/2014 16.6* 11.5 - 14.5 % Final  . Platelets 09/23/2014 228  150 - 440 K/uL Final  . Neutrophils Relative % 09/23/2014 84%   Final  . Neutro Abs 09/23/2014 7.7* 1.4 - 6.5 K/uL Final  . Lymphocytes Relative 09/23/2014 6%   Final  . Lymphs Abs 09/23/2014 0.6* 1.0 - 3.6 K/uL Final  . Monocytes Relative 09/23/2014 8%   Final  . Monocytes Absolute 09/23/2014 0.7  0.2 - 0.9 K/uL Final  . Eosinophils Relative 09/23/2014 1%   Final  . Eosinophils Absolute 09/23/2014 0.1  0 - 0.7 K/uL Final  . Basophils Relative 09/23/2014 1%   Final  . Basophils Absolute 09/23/2014 0.0  0 - 0.1 K/uL Final  . Sodium 09/23/2014 138  135 - 145 mmol/L Final  . Potassium 09/23/2014 3.7  3.5 - 5.1 mmol/L Final  . Chloride 09/23/2014 105  101 - 111 mmol/L Final  . CO2 09/23/2014 28  22 - 32 mmol/L Final  . Glucose, Bld 09/23/2014 93  65 - 99 mg/dL Final  . BUN 09/23/2014 18  6 - 20 mg/dL Final  . Creatinine, Ser 09/23/2014 0.53  0.44 - 1.00 mg/dL Final  . Calcium 09/23/2014 8.9  8.9 - 10.3 mg/dL Final  . Total Protein 09/23/2014 6.2* 6.5 - 8.1 g/dL Final  . Albumin 09/23/2014 3.7  3.5 - 5.0 g/dL Final  . AST 09/23/2014 18  15 - 41 U/L Final  . ALT 09/23/2014 11* 14 - 54 U/L Final  . Alkaline Phosphatase 09/23/2014 81  38 - 126 U/L Final  . Total Bilirubin 09/23/2014 0.5  0.3 - 1.2 mg/dL Final  . GFR calc non  Af Amer 09/23/2014 >60  >60 mL/min Final  . GFR calc Af Amer 09/23/2014 >60  >60 mL/min Final   Comment: (NOTE) The eGFR has been calculated using the CKD EPI equation. This calculation has not been validated in all clinical situations. eGFR's persistently <60 mL/min signify possible Chronic Kidney Disease.   . Anion gap 09/23/2014 5  5 - 15 Final    No results found for: LABCA2 No results found for: CA199 Lab Results  Component Value Date   CEA <0.3 04/16/2014   No results found for: PSA Lab Results  Component Value Date   CA125 47.5* 04/16/2014     STUDIES: No results found.  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  At present time patient is responding to the chemotherapy this is a 6 and the last chemotherapy. She was admitted in the hospital for high-dose methotrexate  MEDICAL DECISION MAKING:  All lab data has been reviewed Repeat PET scan after 6 cycle of chemotherapy High-dose methotrexate in 2 weeks Patient was referred to bone marrow transplant team for consideration of consolidation therapy. Total duration of visit was 45 minutes.  50% or more time was spent in counseling patient and family regarding prognosis and options of treatment and available resources  Patient expressed understanding and was in agreement with this plan. She also understands that She can call clinic at any time with any questions, concerns, or complaints.    Diffuse large B cell lymphoma   Staging form: Lymphoid Neoplasms, AJCC 6th Edition     Clinical: Stage III - April Griffon, MD   09/24/2014 4:56 PM   [place] @ [facility] Telephone:(336) [phone]  Fax:(336) [phone]  April Nolan OB: [birth year]  MR#: [id number]  QQI#:[id number]  Patient Care Team: Nicoletta Dress, MD as PCP - General (Internal Medicine) Christene Lye, MD (General Surgery) Margarita Rana, MD as Referring Physician (Family Medicine)  CHIEF COMPLAINT:  No chief complaint on  file.   Oncology History   1.abnormal PET scan and CT scan Abdominal mass biopsy on 11th of January is consistent with diffuse large cell lymphoma B-cell type Fish panel was negative for any double hit  lymphoma 2.LDH is 276 3.Started on chemotherapy with R CHOP allopurinol was started few days before (May 14, 2014) patient received high-dose methotrexate after cycle 2 4, PET scan has been reviewed (April,18 th 2016) reported to be having excellent respons     Diffuse large B cell lymphoma   09/05/2014 Initial Diagnosis Diffuse large B cell lymphoma    Oncology Flowsheet 09/08/2014 09/24/2014  Day, Cycle Day 1, 6 -  cyclophosphamide (CYTOXAN) IV 1,300 mg -  dexamethasone (DECADRON) IV [ 20 mg ] 10 mg  diphenhydrAMINE (BENADRYL) PO 50 mg -  DOXOrubicin (ADRIAMYCIN) IV 50 mg/m2 -  methotrexate (PF) IV - 6,000 mg  ondansetron (ZOFRAN) IV [ 16 mg ] 12 mg  pegfilgrastim (NEULASTA ONPRO KIT) [place] 6 mg -  riTUXimab (RITUXAN) IV 375 mg/m2 -  vinCRIStine (ONCOVIN) IV 2 mg -    INTERVAL HISTORY: 59 year old lady with diffuse large B-cell lymphoma here to initiate 6 cycle of chemotherapy.  Patient has tolerated treatment very well.  Has occasional abdominal discomfort.  No nausea no vomiting.  No soreness in the mouth.  No tingling numbness.  Patient is also due for high-dose methotrexate third and the last treatment. September 24, 2014 Patient was admitted in the hospital for high-dose methotrexate treatment.  Patient remains asymptomatic at present time REVIEW OF SYSTEMS:   GENERAL:  Feels good.  Active.  No fevers, sweats or weight loss. PERFORMANCE STATUS (ECOG):  0 HEENT:  No visual changes, runny nose, sore throat, mouth sores or tenderness. Lungs: No shortness of breath or cough.  No hemoptysis. Cardiac:  No chest pain, palpitations, orthopnea, or PND. GI:  No nausea, vomiting, diarrhea, constipation, melena or hematochezia. GU:  No urgency, frequency, dysuria, or  hematuria. Musculoskeletal:  No back pain.  No joint pain.  No muscle tenderness. Extremities:  No pain or swelling. Skin:  No rashes or skin changes. Neuro:  No headache, numbness or weakness, balance or coordination issues. Endocrine:  No diabetes, thyroid issues, hot flashes or night sweats. Psych:  No mood changes, depression or anxiety. Pain:  No focal pain. Review of systems:  All other systems reviewed and found to be negative.  As per HPI. Otherwise, a complete review of systems is negatve.  PAST MEDICAL HISTORY: Past Medical History  Diagnosis Date  . GERD (gastroesophageal reflux disease)   . 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 Anemia   . Fatty liver unknown  . 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 Upper gi endoscopy  2004    Dr Vira Agar  . Colonoscopy  2012    Dr. Vira Agar    FAMILY HISTORY Family History  Problem Relation Age of Onset  . Cancer Father     bone/brain    GYNECOLOGIC HISTORY:  No LMP recorded. Patient is postmenopausal.     ADVANCED DIRECTIVES: Does not have any advance care directive   HEALTH MAINTENANCE: History  Substance Use  Topics  . Smoking status: Never Smoker   . Smokeless tobacco: Not on file  . Alcohol Use: No     Comment: 4/week     Allergies  Allergen Reactions  . Pollen Extract Itching    Current Facility-Administered Medications  Medication Dose Route Frequency Provider Last Rate Last Dose  . acetaminophen (TYLENOL) tablet 650 mg  650 mg Oral Q4H PRN Evlyn Kanner, NP      . Derrill Memo ON 09/25/2014] leucovorin (WELLCOVORIN) 26 mg in dextrose 5 % 100 mL injection   Intravenous Q6H Evlyn Kanner, NP      . methotrexate (PF) 6,000 mg in sodium chloride 0.9 % 1,000 mL chemo infusion  6,000 mg Intravenous Once Forest Gleason, MD 310 mL/hr at 09/24/14 1455 6,000 mg at 09/24/14 1455  . ondansetron (ZOFRAN) tablet 4-8 mg  4-8 mg Oral Q8H  PRN Evlyn Kanner, NP       Or  . ondansetron (ZOFRAN-ODT) disintegrating tablet 4-8 mg  4-8 mg Oral Q8H PRN Evlyn Kanner, NP       Or  . ondansetron (ZOFRAN) injection 4 mg  4 mg Intravenous Q8H PRN Evlyn Kanner, NP       Or  . ondansetron (ZOFRAN) 8 mg in sodium chloride 0.9 % 50 mL IVPB  8 mg Intravenous Q8H PRN Evlyn Kanner, NP      . senna-docusate (Senokot-S) tablet 1 tablet  1 tablet Oral QHS PRN Evlyn Kanner, NP      . sodium bicarbonate tablet 1,300 mg  1,300 mg Oral Q2H PRN Evlyn Kanner, NP      . sodium chloride 0.225 % with sodium bicarbonate 100 mEq infusion   Intravenous Continuous Evlyn Kanner, NP 150 mL/hr at 09/24/14 1555      OBJECTIVE:  Filed Vitals:   09/24/14 1319  BP: 113/71  Pulse: 85  Temp: 98.2 F (36.8 C)  Resp:      Body mass index is 26.81 kg/(m^2).    ECOG FS:0 - Asymptomatic  PHYSICAL EXAM: General  status: Performance status is good.  Patient has not lost significant weight HEENT: No evidence of stomatitis.  And alopecia Sclera and conjunctivae :: No jaundice.   pale looking . Lungs: Air  entry equal on both sides.  No rhonchi.  No rales.  Cardiac: Heart sounds are normal.  No pericardial rub.  No murmur. Lymphatic system: Cervical, axillary, inguinal, lymph nodes not palpable GI: Abdomen is soft.  No ascites.  Liver spleen not palpable.  No tenderness.  Bowel sounds are within normal limit Lower extremity: No edema Neurological system: Higher functions, cranial nerves intact No evidence of peripheral neuropathy. Skin: No rash.  No ecchymosis.Marland Kitchen     LAB RESULTS:  Admission on 09/24/2014  Component Date Value Ref Range Status  . pH 09/24/2014 5.0  5.0 - 8.0 Final  . pH 09/24/2014 6.0  5.0 - 8.0 Final  . pH 09/24/2014 7.0  5.0 - 8.0 Final  . pH 09/24/2014 9.0* 5.0 - 8.0 Final  Appointment on 09/23/2014  Component Date Value Ref Range Status  . WBC 09/23/2014 9.1  3.6 - 11.0 K/uL Final  . RBC 09/23/2014 3.87   3.80 - 5.20 MIL/uL Final  . Hemoglobin 09/23/2014 11.6* 12.0 - 16.0 g/dL Final  . HCT 09/23/2014 34.9* 35.0 - 47.0 % Final  . MCV 09/23/2014 90.2  80.0 - 100.0 fL Final  . MCH 09/23/2014 30.0  26.0 - 34.0 pg Final  .  MCHC 09/23/2014 33.3  32.0 - 36.0 g/dL Final  . RDW 09/23/2014 16.6* 11.5 - 14.5 % Final  . Platelets 09/23/2014 228  150 - 440 K/uL Final  . Neutrophils Relative % 09/23/2014 84%   Final  . Neutro Abs 09/23/2014 7.7* 1.4 - 6.5 K/uL Final  . Lymphocytes Relative 09/23/2014 6%   Final  . Lymphs Abs 09/23/2014 0.6* 1.0 - 3.6 K/uL Final  . Monocytes Relative 09/23/2014 8%   Final  . Monocytes Absolute 09/23/2014 0.7  0.2 - 0.9 K/uL Final  . Eosinophils Relative 09/23/2014 1%   Final  . Eosinophils Absolute 09/23/2014 0.1  0 - 0.7 K/uL Final  . Basophils Relative 09/23/2014 1%   Final  . Basophils Absolute 09/23/2014 0.0  0 - 0.1 K/uL Final  . Sodium 09/23/2014 138  135 - 145 mmol/L Final  . Potassium 09/23/2014 3.7  3.5 - 5.1 mmol/L Final  . Chloride 09/23/2014 105  101 - 111 mmol/L Final  . CO2 09/23/2014 28  22 - 32 mmol/L Final  . Glucose, Bld 09/23/2014 93  65 - 99 mg/dL Final  . BUN 09/23/2014 18  6 - 20 mg/dL Final  . Creatinine, Ser 09/23/2014 0.53  0.44 - 1.00 mg/dL Final  . Calcium 09/23/2014 8.9  8.9 - 10.3 mg/dL Final  . Total Protein 09/23/2014 6.2* 6.5 - 8.1 g/dL Final  . Albumin 09/23/2014 3.7  3.5 - 5.0 g/dL Final  . AST 09/23/2014 18  15 - 41 U/L Final  . ALT 09/23/2014 11* 14 - 54 U/L Final  . Alkaline Phosphatase 09/23/2014 81  38 - 126 U/L Final  . Total Bilirubin 09/23/2014 0.5  0.3 - 1.2 mg/dL Final  . GFR calc non Af Amer 09/23/2014 >60  >60 mL/min Final  . GFR calc Af Amer 09/23/2014 >60  >60 mL/min Final   Comment: (NOTE) The eGFR has been calculated using the CKD EPI equation. This calculation has not been validated in all clinical situations. eGFR's persistently <60 mL/min signify possible Chronic Kidney Disease.   . Anion gap 09/23/2014 5   5 - 15 Final    No results found for: LABCA2 No results found for: CA199 Lab Results  Component Value Date   CEA <0.3 04/16/2014   No results found for: PSA Lab Results  Component Value Date   CA125 47.5* 04/16/2014     STUDIES: No results found.  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  At present time patient is responding to the chemotherapy this is a 6 and the last chemotherapy. She was admitted in the hospital for high-dose methotrexate  MEDICAL DECISION MAKING:  All lab data has been reviewed Repeat PET scan after 6 cycle of chemotherapy High-dose methotrexate in 2 weeks Patient was referred to bone marrow transplant team for consideration of consolidation therapy. Total duration of visit was 45 minutes.  50% or more time was spent in counseling patient and family regarding prognosis and options of treatment and available resources  Patient expressed understanding and was in agreement with this plan. She also understands that She can call clinic at any time with any questions, concerns, or complaints.    Diffuse large B cell lymphoma   Staging form: Lymphoid Neoplasms, AJCC 6th Edition     Clinical: Stage III - April Griffon, MD   09/24/2014 4:56 PM

## 2014-09-24 NOTE — Progress Notes (Signed)
Pt signed consent for methotrexate and leucovorin administration under the supervison of dr. Magda Bernheim I, RN 09/24/2014 1:28 PM

## 2014-09-24 NOTE — Plan of Care (Signed)
Problem: Discharge Progression Outcomes Goal: Other Discharge Outcomes/Goals Plan of Care Progress to Goal:  Pt to receive high dose MTX for diffuse B cell lymphoma with LCV rescue.  Pt tolerating chemo administration well.  Urine to be monitored q 2 hours per orders for PH and PRN med available if PH <7 , no c/o pain at this time.

## 2014-09-25 DIAGNOSIS — Z780 Asymptomatic menopausal state: Secondary | ICD-10-CM

## 2014-09-25 DIAGNOSIS — C8333 Diffuse large B-cell lymphoma, intra-abdominal lymph nodes: Secondary | ICD-10-CM

## 2014-09-25 DIAGNOSIS — K760 Fatty (change of) liver, not elsewhere classified: Secondary | ICD-10-CM

## 2014-09-25 DIAGNOSIS — Z79899 Other long term (current) drug therapy: Secondary | ICD-10-CM

## 2014-09-25 LAB — URINALYSIS, DIPSTICK ONLY
BILIRUBIN URINE: NEGATIVE
Bilirubin Urine: NEGATIVE
Bilirubin Urine: NEGATIVE
Bilirubin Urine: NEGATIVE
Bilirubin Urine: NEGATIVE
GLUCOSE, UA: NEGATIVE mg/dL
Glucose, UA: NEGATIVE mg/dL
Glucose, UA: NEGATIVE mg/dL
Glucose, UA: NEGATIVE mg/dL
Glucose, UA: NEGATIVE mg/dL
HGB URINE DIPSTICK: NEGATIVE
HGB URINE DIPSTICK: NEGATIVE
HGB URINE DIPSTICK: NEGATIVE
Hgb urine dipstick: NEGATIVE
Hgb urine dipstick: NEGATIVE
Ketones, ur: NEGATIVE mg/dL
Ketones, ur: NEGATIVE mg/dL
Ketones, ur: NEGATIVE mg/dL
Ketones, ur: NEGATIVE mg/dL
Ketones, ur: NEGATIVE mg/dL
LEUKOCYTES UA: NEGATIVE
LEUKOCYTES UA: NEGATIVE
LEUKOCYTES UA: NEGATIVE
LEUKOCYTES UA: NEGATIVE
Leukocytes, UA: NEGATIVE
NITRITE: NEGATIVE
Nitrite: NEGATIVE
Nitrite: NEGATIVE
Nitrite: NEGATIVE
Nitrite: NEGATIVE
PH: 9 — AB (ref 5.0–8.0)
PH: 9 — AB (ref 5.0–8.0)
PH: 9 — AB (ref 5.0–8.0)
PH: 8 (ref 5.0–8.0)
PH: 7 (ref 5.0–8.0)
PROTEIN: NEGATIVE mg/dL
PROTEIN: NEGATIVE mg/dL
PROTEIN: NEGATIVE mg/dL
Protein, ur: NEGATIVE mg/dL
Protein, ur: NEGATIVE mg/dL
SPECIFIC GRAVITY, URINE: 1.013 (ref 1.005–1.030)
Specific Gravity, Urine: 1.012 (ref 1.005–1.030)
Specific Gravity, Urine: 1.005 (ref 1.005–1.030)
Specific Gravity, Urine: 1.013 (ref 1.005–1.030)
Specific Gravity, Urine: 1.013 (ref 1.005–1.030)
Urobilinogen, UA: NORMAL mg/dL (ref 0.0–1.0)
pH: 9 — ABNORMAL HIGH (ref 5.0–8.0)
pH: 9 — ABNORMAL HIGH (ref 5.0–8.0)
pH: 9 — ABNORMAL HIGH (ref 5.0–8.0)
pH: 8 (ref 5.0–8.0)

## 2014-09-25 LAB — URINALYSIS COMPLETE WITH MICROSCOPIC (ARMC ONLY)
BILIRUBIN URINE: NEGATIVE
Glucose, UA: NEGATIVE mg/dL
Hgb urine dipstick: NEGATIVE
KETONES UR: NEGATIVE mg/dL
LEUKOCYTES UA: NEGATIVE
Nitrite: NEGATIVE
PH: 9 — AB (ref 5.0–8.0)
Protein, ur: NEGATIVE mg/dL
SPECIFIC GRAVITY, URINE: 1.016 (ref 1.005–1.030)
WBC UA: NONE SEEN WBC/hpf (ref 0–5)

## 2014-09-25 NOTE — Care Management (Signed)
Admitted to [facility] with the diagnosis of diffused large b cell lymphoma. Lives alone. Brother is Lennette Bihari [phone]). Sees Dr, Jeb Levering at the [facility] Will be receiving chemotherapy treatments every 6 hours. Last dose will be late Friday afternoon. Possible discharge Saturday. Shelbie Ammons RN MSn Care Management [phone]

## 2014-09-25 NOTE — Progress Notes (Signed)
[place] @ [facility] [phone]  Fax:([phone] [place]: 02/10/1956  MR#: [id number]  EUM#:[id number]  Patient Care Team: Nicoletta Dress, MD as PCP - General (Internal Medicine) Christene Lye, MD (General Surgery) Margarita Rana, MD as Referring Physician (Family Medicine)  CHIEF COMPLAINT:  No chief complaint on file.   Oncology History   1.abnormal PET scan and CT scan Abdominal mass biopsy on 11th of January is consistent with diffuse large cell lymphoma B-cell type Fish panel was negative for any double hit  lymphoma 2.LDH is 276 3.Started on chemotherapy with R [facility] allopurinol was started few days before (May 14, 2014) patient received high-dose methotrexate after cycle 2 4, PET scan has been reviewed (April,18 th 2016) reported to be having excellent respons     Diffuse large B cell lymphoma   09/05/2014 Initial Diagnosis Diffuse large B cell lymphoma    Oncology Flowsheet 09/08/2014 09/24/2014  Day, Cycle Day 1, 6 -  cyclophosphamide (CYTOXAN) IV 1,300 mg -  dexamethasone (DECADRON) IV [ 20 mg ] 10 mg  diphenhydrAMINE (BENADRYL) PO 50 mg -  DOXOrubicin (ADRIAMYCIN) IV 50 mg/m2 -  methotrexate (PF) IV - 6,000 mg  ondansetron (ZOFRAN) IV [ 16 mg ] 12 mg  pegfilgrastim (NEULASTA ONPRO KIT) Kountze 6 mg -  riTUXimab (RITUXAN) IV 375 mg/m2 -  vinCRIStine (ONCOVIN) IV 2 mg -    INTERVAL HISTORY: 59 year old lady with diffuse large B-cell lymphoma here to initiate 6 cycle of chemotherapy.  Patient has tolerated treatment very well.  Has occasional abdominal discomfort.  No nausea no vomiting.  No soreness in the mouth.  No tingling numbness.  Patient is also due for high-dose methotrexate third and the last treatment. September 25, 2014 Patient has finished high-dose methotrexate.  Level is pending. No nausea.  No vomiting.  No diarrhea. REVIEW OF SYSTEMS:   GENERAL:  Feels good.  Active.  No fevers, sweats or weight loss. PERFORMANCE  STATUS (ECOG):  0 HEENT:  No visual changes, runny nose, sore throat, mouth sores or tenderness. Lungs: No shortness of breath or cough.  No hemoptysis. Cardiac:  No chest pain, palpitations, orthopnea, or PND. GI:  No nausea, vomiting, diarrhea, constipation, melena or hematochezia. GU:  No urgency, frequency, dysuria, or hematuria. Musculoskeletal:  No back pain.  No joint pain.  No muscle tenderness. Extremities:  No pain or swelling. Skin:  No rashes or skin changes. Neuro:  No headache, numbness or weakness, balance or coordination issues. Endocrine:  No diabetes, thyroid issues, hot flashes or night sweats. Psych:  No mood changes, depression or anxiety. Pain:  No focal pain. Review of systems:  All other systems reviewed and found to be negative.  As per HPI. Otherwise, a complete review of systems is negatve.  PAST MEDICAL HISTORY: Past Medical History  Diagnosis Date  . GERD (gastroesophageal reflux disease)   . 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 Anemia   . Fatty liver unknown  . 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 Upper gi endoscopy  2004    Dr Vira Agar  . Colonoscopy  2012    Dr. Vira Agar    FAMILY HISTORY Family History  Problem Relation Age of Onset  . Cancer Father     bone/brain    GYNECOLOGIC HISTORY:  No LMP recorded. Patient is postmenopausal.     ADVANCED DIRECTIVES: Does not have  any advance care directive   HEALTH MAINTENANCE: History  Substance Use Topics  . Smoking status: Never Smoker   . Smokeless tobacco: Not on file  . Alcohol Use: No     Comment: 4/week     Allergies  Allergen Reactions  . Pollen Extract Itching    Current Facility-Administered Medications  Medication Dose Route Frequency Provider Last Rate Last Dose  . acetaminophen (TYLENOL) tablet 650 mg  650 mg Oral Q4H PRN Evlyn Kanner, NP      . leucovorin (WELLCOVORIN) 26 mg  in dextrose 5 % 100 mL injection   Intravenous Q6H Evlyn Kanner, NP      . ondansetron (ZOFRAN) tablet 4-8 mg  4-8 mg Oral Q8H PRN Evlyn Kanner, NP       Or  . ondansetron (ZOFRAN-ODT) disintegrating tablet 4-8 mg  4-8 mg Oral Q8H PRN Evlyn Kanner, NP       Or  . ondansetron (ZOFRAN) injection 4 mg  4 mg Intravenous Q8H PRN Evlyn Kanner, NP       Or  . ondansetron (ZOFRAN) 8 mg in sodium chloride 0.9 % 50 mL IVPB  8 mg Intravenous Q8H PRN Evlyn Kanner, NP      . senna-docusate (Senokot-S) tablet 1 tablet  1 tablet Oral QHS PRN Evlyn Kanner, NP      . sodium bicarbonate tablet 1,300 mg  1,300 mg Oral Q2H PRN Evlyn Kanner, NP      . sodium chloride 0.9 % 1,000 mL with sodium bicarbonate 100 mEq infusion   Intravenous Continuous Evlyn Kanner, NP 75 mL/hr at 09/25/14 1419      OBJECTIVE:  Filed Vitals:   09/25/14 1343  BP: 106/55  Pulse: 74  Temp: 98.1 F (36.7 C)  Resp:      Body mass index is 26.81 kg/(m^2).    ECOG FS:0 - Asymptomatic  PHYSICAL EXAM: General  status: Performance status is good.  Patient has not lost significant weight HEENT: No evidence of stomatitis.  And alopecia Sclera and conjunctivae :: No jaundice.   pale looking . Lungs: Air  entry equal on both sides.  No rhonchi.  No rales.  Cardiac: Heart sounds are normal.  No pericardial rub.  No murmur. Lymphatic system: Cervical, axillary, inguinal, lymph nodes not palpable GI: Abdomen is soft.  No ascites.  Liver spleen not palpable.  No tenderness.  Bowel sounds are within normal limit Lower extremity: No edema Neurological system: Higher functions, cranial nerves intact No evidence of peripheral neuropathy. Skin: No rash.  No ecchymosis.Marland Kitchen     LAB RESULTS:  Admission on 09/24/2014  Component Date Value Ref Range Status  . pH 09/24/2014 5.0  5.0 - 8.0 Final  . pH 09/24/2014 6.0  5.0 - 8.0 Final  . pH 09/24/2014 7.0  5.0 - 8.0 Final  . pH 09/24/2014 8.0  5.0 - 8.0 Final  .  pH 09/24/2014 8.0  5.0 - 8.0 Corrected  . pH 09/24/2014 9.0* 5.0 - 8.0 Final  . pH 09/24/2014 7.0  5.0 - 8.0 Final  . Specific Gravity, Urine 09/24/2014 1.013  1.005 - 1.030 Final  . pH 09/24/2014 9.0* 5.0 - 8.0 Final  . Glucose, UA 09/24/2014 NEGATIVE  NEGATIVE mg/dL Final  . Hgb urine dipstick 09/24/2014 NEGATIVE  NEGATIVE Final  . Bilirubin Urine 09/24/2014 NEGATIVE  NEGATIVE Final  . Ketones, ur 09/24/2014 NEGATIVE  NEGATIVE mg/dL Final  . Protein, ur 09/24/2014 NEGATIVE  NEGATIVE  mg/dL Final  . Urobilinogen, UA 09/24/2014 NORMAL  0.0 - 1.0 mg/dL Final  . Nitrite 09/24/2014 NEGATIVE  NEGATIVE Final  . Leukocytes, UA 09/24/2014 NEGATIVE  NEGATIVE Final  . Specific Gravity, Urine 09/25/2014 1.012  1.005 - 1.030 Final  . pH 09/25/2014 9.0* 5.0 - 8.0 Final  . Glucose, UA 09/25/2014 NEGATIVE  NEGATIVE mg/dL Final  . Hgb urine dipstick 09/25/2014 NEGATIVE  NEGATIVE Final  . Bilirubin Urine 09/25/2014 NEGATIVE  NEGATIVE Final  . Ketones, ur 09/25/2014 NEGATIVE  NEGATIVE mg/dL Final  . Protein, ur 09/25/2014 NEGATIVE  NEGATIVE mg/dL Final  . Nitrite 09/25/2014 NEGATIVE  NEGATIVE Final  . Leukocytes, UA 09/25/2014 NEGATIVE  NEGATIVE Final  . Specific Gravity, Urine 09/25/2014 1.005  1.005 - 1.030 Final  . pH 09/25/2014 9.0* 5.0 - 8.0 Final  . Glucose, UA 09/25/2014 NEGATIVE  NEGATIVE mg/dL Final  . Hgb urine dipstick 09/25/2014 NEGATIVE  NEGATIVE Final  . Bilirubin Urine 09/25/2014 NEGATIVE  NEGATIVE Final  . Ketones, ur 09/25/2014 NEGATIVE  NEGATIVE mg/dL Final  . Protein, ur 09/25/2014 NEGATIVE  NEGATIVE mg/dL Final  . Nitrite 09/25/2014 NEGATIVE  NEGATIVE Final  . Leukocytes, UA 09/25/2014 NEGATIVE  NEGATIVE Final  . Specific Gravity, Urine 09/25/2014 1.013  1.005 - 1.030 Final  . pH 09/25/2014 9.0* 5.0 - 8.0 Final  . Glucose, UA 09/25/2014 NEGATIVE  NEGATIVE mg/dL Final  . Hgb urine dipstick 09/25/2014 NEGATIVE  NEGATIVE Final  . Bilirubin Urine 09/25/2014 NEGATIVE  NEGATIVE Final    . Ketones, ur 09/25/2014 NEGATIVE  NEGATIVE mg/dL Final  . Protein, ur 09/25/2014 NEGATIVE  NEGATIVE mg/dL Final  . Nitrite 09/25/2014 NEGATIVE  NEGATIVE Final  . Leukocytes, UA 09/25/2014 NEGATIVE  NEGATIVE Final  . Specific Gravity, Urine 09/25/2014 1.015  1.005 - 1.030 Final  . pH 09/25/2014 9.0* 5.0 - 8.0 Final  . Glucose, UA 09/25/2014 NEGATIVE  NEGATIVE mg/dL Final  . Hgb urine dipstick 09/25/2014 NEGATIVE  NEGATIVE Final  . Bilirubin Urine 09/25/2014 NEGATIVE  NEGATIVE Final  . Ketones, ur 09/25/2014 NEGATIVE  NEGATIVE mg/dL Final  . Protein, ur 09/25/2014 NEGATIVE  NEGATIVE mg/dL Final  . Urobilinogen, UA 09/25/2014 PENDING  0.0 - 1.0 mg/dL Incomplete  . Nitrite 09/25/2014 NEGATIVE  NEGATIVE Final  . Leukocytes, UA 09/25/2014 NEGATIVE  NEGATIVE Final  . pH 09/25/2014 9.0* 5.0 - 8.0 Final  . pH 09/25/2014 9.0* 5.0 - 8.0 Final  . Specific Gravity, Urine 09/25/2014 1.012  1.005 - 1.030 Final  . pH 09/25/2014 8.0  5.0 - 8.0 Final  . Glucose, UA 09/25/2014 NEGATIVE  NEGATIVE mg/dL Final  . Hgb urine dipstick 09/25/2014 NEGATIVE  NEGATIVE Final  . Bilirubin Urine 09/25/2014 NEGATIVE  NEGATIVE Final  . Ketones, ur 09/25/2014 NEGATIVE  NEGATIVE mg/dL Final  . Protein, ur 09/25/2014 NEGATIVE  NEGATIVE mg/dL Final  . Urobilinogen, UA 09/25/2014 PENDING  0.0 - 1.0 mg/dL Incomplete  . Nitrite 09/25/2014 NEGATIVE  NEGATIVE Final  . Leukocytes, UA 09/25/2014 NEGATIVE  NEGATIVE Final  . pH 09/25/2014 8.0  5.0 - 8.0 Final  Appointment on 09/23/2014  Component Date Value Ref Range Status  . WBC 09/23/2014 9.1  3.6 - 11.0 K/uL Final  . RBC 09/23/2014 3.87  3.80 - 5.20 MIL/uL Final  . Hemoglobin 09/23/2014 11.6* 12.0 - 16.0 g/dL Final  . HCT 09/23/2014 34.9* 35.0 - 47.0 % Final  . MCV 09/23/2014 90.2  80.0 - 100.0 fL Final  . MCH 09/23/2014 30.0  26.0 - 34.0 pg Final  . MCHC 09/23/2014  33.3  32.0 - 36.0 g/dL Final  . RDW 09/23/2014 16.6* 11.5 - 14.5 % Final  . Platelets 09/23/2014 228   150 - 440 K/uL Final  . Neutrophils Relative % 09/23/2014 84%   Final  . Neutro Abs 09/23/2014 7.7* 1.4 - 6.5 K/uL Final  . Lymphocytes Relative 09/23/2014 6%   Final  . Lymphs Abs 09/23/2014 0.6* 1.0 - 3.6 K/uL Final  . Monocytes Relative 09/23/2014 8%   Final  . Monocytes Absolute 09/23/2014 0.7  0.2 - 0.9 K/uL Final  . Eosinophils Relative 09/23/2014 1%   Final  . Eosinophils Absolute 09/23/2014 0.1  0 - 0.7 K/uL Final  . Basophils Relative 09/23/2014 1%   Final  . Basophils Absolute 09/23/2014 0.0  0 - 0.1 K/uL Final  . Sodium 09/23/2014 138  135 - 145 mmol/L Final  . Potassium 09/23/2014 3.7  3.5 - 5.1 mmol/L Final  . Chloride 09/23/2014 105  101 - 111 mmol/L Final  . CO2 09/23/2014 28  22 - 32 mmol/L Final  . Glucose, Bld 09/23/2014 93  65 - 99 mg/dL Final  . BUN 09/23/2014 18  6 - 20 mg/dL Final  . Creatinine, Ser 09/23/2014 0.53  0.44 - 1.00 mg/dL Final  . Calcium 09/23/2014 8.9  8.9 - 10.3 mg/dL Final  . Total Protein 09/23/2014 6.2* 6.5 - 8.1 g/dL Final  . Albumin 09/23/2014 3.7  3.5 - 5.0 g/dL Final  . AST 09/23/2014 18  15 - 41 U/L Final  . ALT 09/23/2014 11* 14 - 54 U/L Final  . Alkaline Phosphatase 09/23/2014 81  38 - 126 U/L Final  . Total Bilirubin 09/23/2014 0.5  0.3 - 1.2 mg/dL Final  . GFR calc non Af Amer 09/23/2014 >60  >60 mL/min Final  . GFR calc Af Amer 09/23/2014 >60  >60 mL/min Final   Comment: (NOTE) The eGFR has been calculated using the CKD EPI equation. This calculation has not been validated in all clinical situations. eGFR's persistently <60 mL/min signify possible Chronic Kidney Disease.   . Anion gap 09/23/2014 5  5 - 15 Final    No results found for: LABCA2 No results found for: CA199 Lab Results  Component Value Date   CEA <0.3 04/16/2014   No results found for: PSA Lab Results  Component Value Date   CA125 47.5* 04/16/2014     STUDIES: No results found.  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  At present time  patient is responding to the chemotherapy this is a 6 and the last chemotherapy. Patient would be admitted in the hospital for high-dose methotrexate.  MEDICAL DECISION MAKING:  All urine pH has been measured. Remains more than 8 Continue leucovorin Patient expressed understanding and was in agreement with this plan. She also understands that She can call clinic at any time with any questions, concerns, or complaints.    Diffuse large B cell lymphoma   Staging form: Lymphoid Neoplasms, AJCC 6th Edition     Clinical: Stage III - Marni Griffon, MD   09/25/2014 4:15 PM

## 2014-09-26 DIAGNOSIS — R231 Pallor: Secondary | ICD-10-CM

## 2014-09-26 DIAGNOSIS — L658 Other specified nonscarring hair loss: Secondary | ICD-10-CM

## 2014-09-26 DIAGNOSIS — Z808 Family history of malignant neoplasm of other organs or systems: Secondary | ICD-10-CM

## 2014-09-26 LAB — COMPREHENSIVE METABOLIC PANEL
ALK PHOS: 52 U/L (ref 38–126)
ALT: 19 U/L (ref 14–54)
AST: 22 U/L (ref 15–41)
Albumin: 3.2 g/dL — ABNORMAL LOW (ref 3.5–5.0)
Anion gap: 4 — ABNORMAL LOW (ref 5–15)
BILIRUBIN TOTAL: 0.7 mg/dL (ref 0.3–1.2)
BUN: 16 mg/dL (ref 6–20)
CO2: 30 mmol/L (ref 22–32)
CREATININE: 0.65 mg/dL (ref 0.44–1.00)
Calcium: 8.5 mg/dL — ABNORMAL LOW (ref 8.9–10.3)
Chloride: 107 mmol/L (ref 101–111)
GFR calc Af Amer: >60 mL/min (ref >60)
GFR calc non Af Amer: >60 mL/min (ref >60)
Glucose, Bld: 92 mg/dL (ref 65–99)
Potassium: 3.8 mmol/L (ref 3.5–5.1)
Sodium: 141 mmol/L (ref 135–145)
Total Protein: 5.4 g/dL — ABNORMAL LOW (ref 6.5–8.1)

## 2014-09-26 LAB — CBC WITH DIFFERENTIAL/PLATELET
BASOS PCT: 0 %
Basophils Absolute: 0 10*3/uL (ref 0–0.1)
Eosinophils Absolute: 0 10*3/uL (ref 0–0.7)
Eosinophils Relative: 0 %
HEMATOCRIT: 29.4 % — AB (ref 35.0–47.0)
HEMOGLOBIN: 9.7 g/dL — AB (ref 12.0–16.0)
LYMPHS PCT: 10 %
Lymphs Abs: 0.6 10*3/uL — ABNORMAL LOW (ref 1.0–3.6)
MCH: 30.1 pg (ref 26.0–34.0)
MCHC: 33 g/dL (ref 32.0–36.0)
MCV: 91.5 fL (ref 80.0–100.0)
Monocytes Absolute: 0.5 10*3/uL (ref 0.2–0.9)
Monocytes Relative: 9 %
NEUTROS ABS: 4.7 10*3/uL (ref 1.4–6.5)
Neutrophils Relative %: 81 %
Platelets: 224 10*3/uL (ref 150–440)
RBC: 3.22 MIL/uL — AB (ref 3.80–5.20)
RDW: 17.4 % — ABNORMAL HIGH (ref 11.5–14.5)
WBC: 5.9 10*3/uL (ref 3.6–11.0)

## 2014-09-26 LAB — URINALYSIS, DIPSTICK ONLY
PH: 8 (ref 5.0–8.0)
PH: 8 (ref 5.0–8.0)
pH: 8 (ref 5.0–8.0)
pH: 7 (ref 5.0–8.0)
pH: 7 (ref 5.0–8.0)
pH: 7 (ref 5.0–8.0)
pH: 7 (ref 5.0–8.0)
pH: 7 (ref 5.0–8.0)
pH: 8 (ref 5.0–8.0)
pH: 8 (ref 5.0–8.0)
pH: 7 (ref 5.0–8.0)
pH: 8 (ref 5.0–8.0)

## 2014-09-26 NOTE — Progress Notes (Signed)
[place] @ [facility] [phone]  Fax:([phone]) [place] OB: [birth year]  MR#: [id number]  NIO#:[id number]  Patient Care Team: Nicoletta Dress, MD as PCP - General (Internal Medicine) Christene Lye, MD (General Surgery) Margarita Rana, MD as Referring Physician [facility] Medicine)  CHIEF COMPLAINT:  Admitted for high dose MTX chemotherapy.  Oncology History   1.abnormal PET scan and CT scan Abdominal mass biopsy on 11th of January is consistent with diffuse large cell lymphoma B-cell type Fish panel was negative for any double hit  lymphoma 2.LDH is 276 3.Started on chemotherapy with R [facility] allopurinol was started few days before (May 14, 2014) patient received high-dose methotrexate after cycle 2 4, PET scan has been reviewed (April,18 th 2016) reported to be having excellent respons     Diffuse large B cell lymphoma   09/05/2014 Initial Diagnosis Diffuse large B cell lymphoma    Oncology Flowsheet 09/08/2014 09/24/2014 09/25/2014 09/25/2014 09/26/2014 09/26/2014  Day, Cycle Day 1, 6 - - - - -  cyclophosphamide (CYTOXAN) IV 1,300 mg - - - - -  dexamethasone (DECADRON) IV [ 20 mg ] 10 mg - - - -  diphenhydrAMINE (BENADRYL) PO 50 mg - - - - -  DOXOrubicin (ADRIAMYCIN) IV 50 mg/m2 - - - - -  leucovorin (WELLCOVORIN) IV - - [ 26 mg ] [ 26 mg ] [ 26 mg ] [ 26 mg ]  methotrexate (PF) IV - 6,000 mg - - - -  ondansetron (ZOFRAN) IV [ 16 mg ] 12 mg - - - -  pegfilgrastim (NEULASTA ONPRO KIT) Hermann 6 mg - - - - -  riTUXimab (RITUXAN) IV 375 mg/m2 - - - - -  vinCRIStine (ONCOVIN) IV 2 mg - - - - -    INTERVAL HISTORY: 59 year old lady with diffuse large B-cell lymphoma has completed infusion of high dose methotrexate.  Patient has tolerated treatment very well.  Has occasional abdominal discomfort.  No nausea no vomiting.  No soreness in the mouth.  No tingling numbness.  Patient is also due for high-dose methotrexate third and the last treatment. MTX level from  09/25/14 was 0.99.  REVIEW OF SYSTEMS:   GENERAL:  Feels good.  Active.  No fevers, sweats or weight loss. PERFORMANCE STATUS (ECOG):  0 HEENT:  No visual changes, runny nose, sore throat, mouth sores or tenderness. Lungs: No shortness of breath or cough.  No hemoptysis. Cardiac:  No chest pain, palpitations, orthopnea, or PND. GI:  No nausea, vomiting, diarrhea, constipation, melena or hematochezia. GU:  No urgency, frequency, dysuria, or hematuria. Musculoskeletal:  No back pain.  No joint pain.  No muscle tenderness. Extremities:  No pain or swelling. Skin:  No rashes or skin changes. Neuro:  No headache, numbness or weakness, balance or coordination issues. Endocrine:  No diabetes, thyroid issues, hot flashes or night sweats. Psych:  No mood changes, depression or anxiety. Pain:  No focal pain. Review of systems:  All other systems reviewed and found to be negative.  As per HPI. Otherwise, a complete review of systems is negatve.  PAST MEDICAL HISTORY: Past Medical History  Diagnosis Date  . GERD (gastroesophageal reflux disease)   . 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 Anemia   . Fatty liver unknown  . 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 Upper gi endoscopy  2004    Dr Vira Agar  .  Colonoscopy  2012    Dr. Vira Agar    FAMILY HISTORY Family History  Problem Relation Age of Onset  . Cancer Father     bone/brain    GYNECOLOGIC HISTORY:  No LMP recorded. Patient is postmenopausal.     ADVANCED DIRECTIVES: Does not have any advance care directive   HEALTH MAINTENANCE: History  Substance Use Topics  . Smoking status: Never Smoker   . Smokeless tobacco: Not on file  . Alcohol Use: No     Comment: 4/week     Allergies  Allergen Reactions  . Pollen Extract Itching    Current Facility-Administered Medications  Medication Dose Route Frequency Provider Last Rate Last Dose    . acetaminophen (TYLENOL) tablet 650 mg  650 mg Oral Q4H PRN Evlyn Kanner, NP      . leucovorin (WELLCOVORIN) 26 mg in dextrose 5 % 100 mL injection   Intravenous Q6H Evlyn Kanner, NP      . ondansetron (ZOFRAN) tablet 4-8 mg  4-8 mg Oral Q8H PRN Evlyn Kanner, NP       Or  . ondansetron (ZOFRAN-ODT) disintegrating tablet 4-8 mg  4-8 mg Oral Q8H PRN Evlyn Kanner, NP       Or  . ondansetron (ZOFRAN) injection 4 mg  4 mg Intravenous Q8H PRN Evlyn Kanner, NP       Or  . ondansetron (ZOFRAN) 8 mg in sodium chloride 0.9 % 50 mL IVPB  8 mg Intravenous Q8H PRN Evlyn Kanner, NP      . senna-docusate (Senokot-S) tablet 1 tablet  1 tablet Oral QHS PRN Evlyn Kanner, NP   1 tablet at 09/25/14 2042  . sodium bicarbonate tablet 1,300 mg  1,300 mg Oral Q2H PRN Evlyn Kanner, NP      . sodium chloride 0.9 % 1,000 mL with sodium bicarbonate 100 mEq infusion   Intravenous Continuous Evlyn Kanner, NP 75 mL/hr at 09/26/14 0858      OBJECTIVE:  Filed Vitals:   09/26/14 0520  BP: 117/75  Pulse: 58  Temp: 98.3 F (36.8 C)  Resp: 18     Body mass index is 26.81 kg/(m^2).    ECOG FS:0 - Asymptomatic  PHYSICAL EXAM: General  status: Performance status is good.  Patient has not lost significant weight HEENT: No evidence of stomatitis. Alopecia Sclera and conjunctivae : No jaundice.   pale looking . Lungs: Air  entry equal on both sides.  No rhonchi.  No rales.  Cardiac: Heart sounds are normal.  No pericardial rub.  No murmur. Lymphatic system: Cervical, axillary, inguinal, lymph nodes not palpable GI: Abdomen is soft.  No ascites.  Liver spleen not palpable.  No tenderness.  Bowel sounds are within normal limit Lower extremity: No edema Neurological system: Higher functions, cranial nerves intact No evidence of peripheral neuropathy. Skin: No rash.  No ecchymosis.Marland Kitchen     LAB RESULTS:  Admission on 09/24/2014  Component Date Value Ref Range Status  . pH 09/24/2014  5.0  5.0 - 8.0 Final  . pH 09/24/2014 6.0  5.0 - 8.0 Final  . pH 09/24/2014 7.0  5.0 - 8.0 Final  . pH 09/24/2014 8.0  5.0 - 8.0 Final  . pH 09/24/2014 8.0  5.0 - 8.0 Corrected  . pH 09/24/2014 9.0* 5.0 - 8.0 Final  . pH 09/24/2014 7.0  5.0 - 8.0 Final  . Specific Gravity, Urine 09/24/2014 1.013  1.005 - 1.030 Final  . pH  09/24/2014 9.0* 5.0 - 8.0 Final  . Glucose, UA 09/24/2014 NEGATIVE  NEGATIVE mg/dL Final  . Hgb urine dipstick 09/24/2014 NEGATIVE  NEGATIVE Final  . Bilirubin Urine 09/24/2014 NEGATIVE  NEGATIVE Final  . Ketones, ur 09/24/2014 NEGATIVE  NEGATIVE mg/dL Final  . Protein, ur 09/24/2014 NEGATIVE  NEGATIVE mg/dL Final  . Urobilinogen, UA 09/24/2014 NORMAL  0.0 - 1.0 mg/dL Final  . Nitrite 09/24/2014 NEGATIVE  NEGATIVE Final  . Leukocytes, UA 09/24/2014 NEGATIVE  NEGATIVE Final  . Specific Gravity, Urine 09/25/2014 1.012  1.005 - 1.030 Final  . pH 09/25/2014 9.0* 5.0 - 8.0 Final  . Glucose, UA 09/25/2014 NEGATIVE  NEGATIVE mg/dL Final  . Hgb urine dipstick 09/25/2014 NEGATIVE  NEGATIVE Final  . Bilirubin Urine 09/25/2014 NEGATIVE  NEGATIVE Final  . Ketones, ur 09/25/2014 NEGATIVE  NEGATIVE mg/dL Final  . Protein, ur 09/25/2014 NEGATIVE  NEGATIVE mg/dL Final  . Nitrite 09/25/2014 NEGATIVE  NEGATIVE Final  . Leukocytes, UA 09/25/2014 NEGATIVE  NEGATIVE Final  . Specific Gravity, Urine 09/25/2014 1.005  1.005 - 1.030 Final  . pH 09/25/2014 9.0* 5.0 - 8.0 Final  . Glucose, UA 09/25/2014 NEGATIVE  NEGATIVE mg/dL Final  . Hgb urine dipstick 09/25/2014 NEGATIVE  NEGATIVE Final  . Bilirubin Urine 09/25/2014 NEGATIVE  NEGATIVE Final  . Ketones, ur 09/25/2014 NEGATIVE  NEGATIVE mg/dL Final  . Protein, ur 09/25/2014 NEGATIVE  NEGATIVE mg/dL Final  . Nitrite 09/25/2014 NEGATIVE  NEGATIVE Final  . Leukocytes, UA 09/25/2014 NEGATIVE  NEGATIVE Final  . Specific Gravity, Urine 09/25/2014 1.013  1.005 - 1.030 Final  . pH 09/25/2014 9.0* 5.0 - 8.0 Final  . Glucose, UA 09/25/2014  NEGATIVE  NEGATIVE mg/dL Final  . Hgb urine dipstick 09/25/2014 NEGATIVE  NEGATIVE Final  . Bilirubin Urine 09/25/2014 NEGATIVE  NEGATIVE Final  . Ketones, ur 09/25/2014 NEGATIVE  NEGATIVE mg/dL Final  . Protein, ur 09/25/2014 NEGATIVE  NEGATIVE mg/dL Final  . Nitrite 09/25/2014 NEGATIVE  NEGATIVE Final  . Leukocytes, UA 09/25/2014 NEGATIVE  NEGATIVE Final  . Specific Gravity, Urine 09/25/2014 1.015  1.005 - 1.030 Final  . pH 09/25/2014 9.0* 5.0 - 8.0 Final  . Glucose, UA 09/25/2014 NEGATIVE  NEGATIVE mg/dL Final  . Hgb urine dipstick 09/25/2014 NEGATIVE  NEGATIVE Final  . Bilirubin Urine 09/25/2014 NEGATIVE  NEGATIVE Final  . Ketones, ur 09/25/2014 NEGATIVE  NEGATIVE mg/dL Final  . Protein, ur 09/25/2014 NEGATIVE  NEGATIVE mg/dL Final  . Urobilinogen, UA 09/25/2014 PENDING  0.0 - 1.0 mg/dL Incomplete  . Nitrite 09/25/2014 NEGATIVE  NEGATIVE Final  . Leukocytes, UA 09/25/2014 NEGATIVE  NEGATIVE Final  . pH 09/25/2014 9.0* 5.0 - 8.0 Final  . pH 09/25/2014 9.0* 5.0 - 8.0 Final  . pH 09/25/2014 7.0  5.0 - 8.0 Final  . pH 09/25/2014 8.0  5.0 - 8.0 Final  . pH 09/26/2014 7.0  5.0 - 8.0 Final  . Specific Gravity, Urine 09/25/2014 1.012  1.005 - 1.030 Final  . pH 09/25/2014 8.0  5.0 - 8.0 Final  . Glucose, UA 09/25/2014 NEGATIVE  NEGATIVE mg/dL Final  . Hgb urine dipstick 09/25/2014 NEGATIVE  NEGATIVE Final  . Bilirubin Urine 09/25/2014 NEGATIVE  NEGATIVE Final  . Ketones, ur 09/25/2014 NEGATIVE  NEGATIVE mg/dL Final  . Protein, ur 09/25/2014 NEGATIVE  NEGATIVE mg/dL Final  . Urobilinogen, UA 09/25/2014 PENDING  0.0 - 1.0 mg/dL Incomplete  . Nitrite 09/25/2014 NEGATIVE  NEGATIVE Final  . Leukocytes, UA 09/25/2014 NEGATIVE  NEGATIVE Final  . pH 09/25/2014 8.0  5.0 -  8.0 Final  . Specific Gravity, Urine 09/25/2014 1.013  1.005 - 1.030 Final  . pH 09/25/2014 8.0  5.0 - 8.0 Final  . Glucose, UA 09/25/2014 NEGATIVE  NEGATIVE mg/dL Final  . Hgb urine dipstick 09/25/2014 NEGATIVE  NEGATIVE  Final  . Bilirubin Urine 09/25/2014 NEGATIVE  NEGATIVE Final  . Ketones, ur 09/25/2014 NEGATIVE  NEGATIVE mg/dL Final  . Protein, ur 09/25/2014 NEGATIVE  NEGATIVE mg/dL Final  . Nitrite 09/25/2014 NEGATIVE  NEGATIVE Final  . Leukocytes, UA 09/25/2014 NEGATIVE  NEGATIVE Final  . pH 09/26/2014 8.0  5.0 - 8.0 Final  . pH 09/26/2014 7.0  5.0 - 8.0 Final  . WBC 09/26/2014 5.9  3.6 - 11.0 K/uL Final  . RBC 09/26/2014 3.22* 3.80 - 5.20 MIL/uL Final  . Hemoglobin 09/26/2014 9.7* 12.0 - 16.0 g/dL Final  . HCT 09/26/2014 29.4* 35.0 - 47.0 % Final  . MCV 09/26/2014 91.5  80.0 - 100.0 fL Final  . MCH 09/26/2014 30.1  26.0 - 34.0 pg Final  . MCHC 09/26/2014 33.0  32.0 - 36.0 g/dL Final  . RDW 09/26/2014 17.4* 11.5 - 14.5 % Final  . Platelets 09/26/2014 224  150 - 440 K/uL Final  . Neutrophils Relative % 09/26/2014 81   Final  . Neutro Abs 09/26/2014 4.7  1.4 - 6.5 K/uL Final  . Lymphocytes Relative 09/26/2014 10   Final  . Lymphs Abs 09/26/2014 0.6* 1.0 - 3.6 K/uL Final  . Monocytes Relative 09/26/2014 9   Final  . Monocytes Absolute 09/26/2014 0.5  0.2 - 0.9 K/uL Final  . Eosinophils Relative 09/26/2014 0   Final  . Eosinophils Absolute 09/26/2014 0.0  0 - 0.7 K/uL Final  . Basophils Relative 09/26/2014 0   Final  . Basophils Absolute 09/26/2014 0.0  0 - 0.1 K/uL Final  . pH 09/26/2014 7.0  5.0 - 8.0 Final  . Color, Urine 09/25/2014 YELLOW* YELLOW Final  . APPearance 09/25/2014 HAZY* CLEAR Final  . Glucose, UA 09/25/2014 NEGATIVE  NEGATIVE mg/dL Final  . Bilirubin Urine 09/25/2014 NEGATIVE  NEGATIVE Final  . Ketones, ur 09/25/2014 NEGATIVE  NEGATIVE mg/dL Final  . Specific Gravity, Urine 09/25/2014 1.016  1.005 - 1.030 Final  . Hgb urine dipstick 09/25/2014 NEGATIVE  NEGATIVE Final  . pH 09/25/2014 9.0* 5.0 - 8.0 Final  . Protein, ur 09/25/2014 NEGATIVE  NEGATIVE mg/dL Final  . Nitrite 09/25/2014 NEGATIVE  NEGATIVE Final  . Leukocytes, UA 09/25/2014 NEGATIVE  NEGATIVE Final  . RBC / HPF  09/25/2014 0-5  0 - 5 RBC/hpf Final  . WBC, UA 09/25/2014 NONE SEEN  0 - 5 WBC/hpf Final  . Bacteria, UA 09/25/2014 RARE* NONE SEEN Final  . Squamous Epithelial / LPF 09/25/2014 0-5* NONE SEEN Final  . Amorphous Crystal 09/25/2014 PRESENT   Final  . pH 09/26/2014 7.0  5.0 - 8.0 Final  . pH 09/26/2014 7.0  5.0 - 8.0 Final  . pH 09/26/2014 8.0  5.0 - 8.0 Final  . Sodium 09/26/2014 141  135 - 145 mmol/L Final  . Potassium 09/26/2014 3.8  3.5 - 5.1 mmol/L Final  . Chloride 09/26/2014 107  101 - 111 mmol/L Final  . CO2 09/26/2014 30  22 - 32 mmol/L Final  . Glucose, Bld 09/26/2014 92  65 - 99 mg/dL Final  . BUN 09/26/2014 16  6 - 20 mg/dL Final  . Creatinine, Ser 09/26/2014 0.65  0.44 - 1.00 mg/dL Final  . Calcium 09/26/2014 8.5* 8.9 - 10.3 mg/dL Final  .  Total Protein 09/26/2014 5.4* 6.5 - 8.1 g/dL Final  . Albumin 09/26/2014 3.2* 3.5 - 5.0 g/dL Final  . AST 09/26/2014 22  15 - 41 U/L Final  . ALT 09/26/2014 19  14 - 54 U/L Final  . Alkaline Phosphatase 09/26/2014 52  38 - 126 U/L Final  . Total Bilirubin 09/26/2014 0.7  0.3 - 1.2 mg/dL Final  . GFR calc non Af Amer 09/26/2014 >60  >60 mL/min Final  . GFR calc Af Amer 09/26/2014 >60  >60 mL/min Final   Comment: (NOTE) The eGFR has been calculated using the CKD EPI equation. This calculation has not been validated in all clinical situations. eGFR's persistently <60 mL/min signify possible Chronic Kidney Disease.   . Anion gap 09/26/2014 4* 5 - 15 Final    No results found for: LABCA2 No results found for: CA199 Lab Results  Component Value Date   CEA <0.3 04/16/2014   No results found for: PSA Lab Results  Component Value Date   CA125 47.5* 04/16/2014     STUDIES: No results found.  ASSESSMENT: Stage III diffuse B cell lymphoma.  MEDICAL DECISION MAKING:  1. Diffuse B cell lymphoma. Patient has finished transfusion of high dose methotrexate. Next level due this afternoon approximately 1600. This is her 6th and final  treatment. Urine pH has remained greater than or equal to 7.0. Continue leucovorin until MTX levels are less than 0.19mcroM at which time patient can be discharged per discussion with Dr. COliva Bustard   Dr. PMa Hillockwas available for consultation and review of plan of care for this patient.  Diffuse large B cell lymphoma   Staging form: Lymphoid Neoplasms, AJCC 6th Edition     Clinical: Stage III - Unsigned   LEvlyn Kanner NP   09/26/2014 2:04 PM

## 2014-09-26 NOTE — Plan of Care (Addendum)
Problem: Discharge Progression Outcomes Goal: Other Discharge Outcomes/Goals Outcome: Progressing Plan of Care Progress to Goals:  Pt received high dose methotrexate on 09-24-14. Leucovorin rescue started on 09-25-14. Pt tolerated well. Urine collected q2 hrs for urine ph. All ph levels have been 7 or above. PRN sodium bi-carb ordered for ph <7. Methotrexate level 0.99. MD notified. Level not showing up in results. Paper copy of result in chart. Pt with no c/o pain.

## 2014-09-26 NOTE — Progress Notes (Signed)
[facility] called r/t MTX level reported level of 0.16, Dr. Ma Hillock notified, no orders received. Pt reported to RN this afternoon that her friend came to visit and was cdiff +, room was cleaned with bleach and pt was instructed to wash hands with soap and water and report any GI symptoms or diarrhea to nurse or MD immediately voiced understanding.  Dr. Ma Hillock notified about the cdiff exposure, no orders received.  Donnita [place], RN 09/26/2014 7:31 PM

## 2014-09-26 NOTE — Plan of Care (Signed)
Problem: Discharge Progression Outcomes Goal: Other Discharge Outcomes/Goals Plan of Care Progress to Goal:  Pt without complications from chemo, lcv rescue given per orders, urine ph sent q 2 hours per orders wnl, lab for mtx level sent to [facility] at 1500 per orders.

## 2014-09-27 LAB — CBC WITH DIFFERENTIAL/PLATELET
Basophils Absolute: 0 10*3/uL (ref 0–0.1)
Basophils Relative: 1 %
EOS ABS: 0 10*3/uL (ref 0–0.7)
Eosinophils Relative: 1 %
HEMATOCRIT: 31.5 % — AB (ref 35.0–47.0)
Hemoglobin: 10.5 g/dL — ABNORMAL LOW (ref 12.0–16.0)
LYMPHS ABS: 0.5 10*3/uL — AB (ref 1.0–3.6)
Lymphocytes Relative: 14 %
MCH: 30.1 pg (ref 26.0–34.0)
MCHC: 33.2 g/dL (ref 32.0–36.0)
MCV: 90.7 fL (ref 80.0–100.0)
MONO ABS: 0.2 10*3/uL (ref 0.2–0.9)
Monocytes Relative: 6 %
NEUTROS ABS: 2.7 10*3/uL (ref 1.4–6.5)
NEUTROS PCT: 78 %
PLATELETS: 287 10*3/uL (ref 150–440)
RBC: 3.47 MIL/uL — ABNORMAL LOW (ref 3.80–5.20)
RDW: 17.1 % — ABNORMAL HIGH (ref 11.5–14.5)
WBC: 3.5 10*3/uL — AB (ref 3.6–11.0)

## 2014-09-27 LAB — BASIC METABOLIC PANEL
Anion gap: 4 — ABNORMAL LOW (ref 5–15)
BUN: 15 mg/dL (ref 6–20)
CHLORIDE: 105 mmol/L (ref 101–111)
CO2: 31 mmol/L (ref 22–32)
CREATININE: 0.68 mg/dL (ref 0.44–1.00)
Calcium: 8.8 mg/dL — ABNORMAL LOW (ref 8.9–10.3)
GFR calc Af Amer: >60 mL/min (ref >60)
Glucose, Bld: 98 mg/dL (ref 65–99)
Potassium: 3.9 mmol/L (ref 3.5–5.1)
SODIUM: 140 mmol/L (ref 135–145)

## 2014-09-27 LAB — URINALYSIS, DIPSTICK ONLY
Bilirubin Urine: NEGATIVE
GLUCOSE, UA: NEGATIVE mg/dL
Hgb urine dipstick: NEGATIVE
Ketones, ur: NEGATIVE mg/dL
LEUKOCYTES UA: NEGATIVE
Nitrite: NEGATIVE
PH: 8 (ref 5.0–8.0)
PROTEIN: NEGATIVE mg/dL
Specific Gravity, Urine: 1.005 (ref 1.005–1.030)
pH: 7 (ref 5.0–8.0)
pH: 7 (ref 5.0–8.0)
pH: 8 (ref 5.0–8.0)
pH: 9 — ABNORMAL HIGH (ref 5.0–8.0)

## 2014-09-27 MED ORDER — LEUCOVORIN CALCIUM 10 MG PO TABS
20.0000 mg | ORAL_TABLET | Freq: Once | ORAL | Status: DC
  Filled 2014-09-27: qty 2

## 2014-09-27 MED ORDER — HEPARIN SOD (PORK) LOCK FLUSH 100 UNIT/ML IV SOLN
500.0000 [IU] | INTRAVENOUS | Status: AC | PRN
  Administered 2014-09-27: 12:00:00 500 [IU]
  Filled 2014-09-27: qty 5

## 2014-09-27 MED ORDER — SODIUM CHLORIDE 0.9 % IJ SOLN: 10.0000 mL | INTRAMUSCULAR | Status: DC | PRN

## 2014-09-27 NOTE — Progress Notes (Signed)
ONCOLOGY PROGRESS NOTE -  Chief Complaint/Diagnosis: Diffuse large B-cell lymphoma, received high-dose methotrexate (6000 mg) chemotherapy.  History of present illness: Patient completed high-dose methotrexate chemotherapy, clinically states that she is doing fairly steady. Denies any nausea or vomiting. No fever or chills. No diarrhea. Has intermittent mild abdominal pain, mostly unchanged. No blood in stools or urine. No dysuria. No new headaches, confusion or other neurological symptoms. Ambulating without difficulty. No new cough, dyspnea, chest pain or hemoptysis. Patient wants to go home as soon as possible since she has to be somewhere by 2 PM today.  ROS:  As in HPI above. In addition, no fever, chills or sweats. No new mood disturbances. No earache or discharge. No sore throat, cough, shortness of breath, sputum, hemoptysis or chest pain. No dizziness or palpitation. No new skin rash or bleeding symptoms. Appetite is stable. No new paresthesias in extremities.  Exam: GENERAL: Patient is alert and oriented and in no acute distress. There is no icterus. VITALS: 97.9, 69, 16, 105/70, 95% on room air HEENT: Extraocular movements intact. Oral exam negative for any thrush or erythema.  CVS: S1, S2 heard with regular rate and rhythm.  LUNGS: Bilaterally clear to auscultation. No crepitations. ABDOMEN: Soft, nontender. Bowel sounds present. EXTREMITIES: No pedal edema. NEURO: grossly nonfocal, cranial nerves intact  Labs: 48-hour methotrexate level 0.16. Potassium 3.9, creatinine 0.68, WBC 3500, 78% neutrophils, hemoglobin 10.5, platelets 287.   Impression/Plan: 1. Diffuse large B-cell lymphoma, received high-dose methotrexate (6000 mg) chemotherapy. 48-hour methotrexate level 0.16. Patient wants to go home as soon as possible since she has to be somewhere by 2 PM today, we will therefore send 72 hour methotrexate level little earlier than scheduled time. Have also called in prescription to  continue oral calcium leucovorin 20 mg every 6 hours 4 more doses as outpatient. Patient was advised to keep previously scheduled appointments at cancer centers the same as already scheduled. 2. Nutrition - the patient encouraged to maintain adequate protein calorie intake. Also advised to maintain increased oral fluid intake given recent methotrexate treatment. 3. Renal functions remains in normal range, no mucositis. 4. Anemia - mild, steady hemoglobin. No new symptoms.     5. In between, the patient has been advised to call or come to the ER in case of fevers, bleeding, acute sickness, or new symptoms.          She is agreeable to this plan.        Discharge time spent: 40 minutes.

## 2014-09-27 NOTE — Progress Notes (Signed)
Pt d/c home via wheelchair escorted by staff and family  

## 2014-09-27 NOTE — Progress Notes (Addendum)
3 am urine PH  Sent to lab at 310 am results not back at 455 lab called states the urine is not there or they have misplaced it. 5 am urine sent to lab at 505 am lab is aware    Urine found and results in epic

## 2014-09-27 NOTE — Progress Notes (Signed)
Call [facility] lab to verify MTX level from 12 noon today.  Level reported by [facility] lab of 0.07. Dr. Ma Hillock notified.

## 2014-09-27 NOTE — Plan of Care (Signed)
Problem: Discharge Progression Outcomes Goal: Other Discharge Outcomes/Goals Outcome: Progressing  Plan of Care Progress to Goal:   Pt without complications from chemo, lcv rescue given per orders, urine ph sent q 2 hours per orders wnl

## 2014-09-27 NOTE — Progress Notes (Addendum)
Pt to be discharged home, MTX level drawn and sent to [facility] per orders, pt port d/c'd site clear, pt given d/c instructions r/t activity, diet, followup care and medications voiced understanding. Pharmacy here does not have leucovorin available per orders, Dr. Ma Hillock notified, prescription called into pts outpatient pharmacy per MD and pt informed to get prescription asap and take as prescribed. Voiced understanding.

## 2014-09-30 LAB — METHOTREXATE

## 2014-09-30 NOTE — Discharge Summary (Signed)
Physician Discharge Summary  Patient ID: April Nolan MRN: [id number] DOB/AGE: 1955/10/14 59 y.o.  Admit date: 09/24/2014 Discharge date: 09/30/2014  Admission Diagnoses: Diffuse large cell lymphoma admitted for high-dose methotrexate  Discharge Diagnoses:  Active Problems:   Diffuse large b-cell lymphoma  high-dose methotrexate  Discharged Condition: good good  Hospital Course:   During hospital stay patient received IV fluid.  IV sodium bicarbonate and high-dose methotrexate 6 g intravenously.  Patient did receive nausea medication.  Urine pH was maintained about 7.  Patient tolerated treatment very well.  No nausea.  No vomiting.  No diarrhea.  Methotrexate levels were checked every 24 hours leucovorin was given intravenous.  After methotrexate level was in acceptable range patient was discharged  Consults: None  None  Significant Diagnostic Studies: labs:  None  Treatments: IV hydration  Discharge Exam: Blood pressure 105/70, pulse 69, temperature 97.9 F (36.6 C), temperature source Oral, resp. rate 16, height 5\' 5"  (1.651 m), weight 161 lb 2 oz (73.086 kg), SpO2 95 %.  GENERAL:  Well developed, well nourished, sitting comfortably in the exam room in no acute distress. MENTAL STATUS:  Alert and oriented to person, place and time. HEAD:  Normocephalic, atraumatic, face symmetric, no Cushingoid features. EYES:    Pupils equal round and reactive to light and accomodation.  No conjunctivitis or scleral icterus. ENT:  Oropharynx clear without lesion.  Tongue normal. Mucous membranes moist.  RESPIRATORY:  Clear to auscultation without rales, wheezes or rhonchi. CARDIOVASCULAR:  Regular rate and rhythm without murmur, rub or gallop. BREAST:  Right breast without masses, skin changes or nipple discharge.  Left breast without masses, skin changes or nipple discharge. ABDOMEN:  Soft, non-tender, with active bowel sounds, and no hepatosplenomegaly.  No masses. BACK:  No CVA  tenderness.  No tenderness on percussion of the back or rib cage. SKIN:  No rashes, ulcers or lesions. EXTREMITIES: No edema, no skin discoloration or tenderness.  No palpable cords. LYMPH NODES: No palpable cervical, supraclavicular, axillary or inguinal adenopathy  NEUROLOGICAL: Unremarkable. PSYCH:  Appropriate.  Disposition: 01-Home or Self Care     Medication List    TAKE these medications        acetaminophen 500 MG tablet  Commonly known as:  TYLENOL  Take 500 mg by mouth every 6 (six) hours as needed for mild pain.     citalopram 20 MG tablet  Commonly known as:  CELEXA  Take 20 mg by mouth daily.     diphenhydrAMINE 25 MG tablet  Commonly known as:  BENADRYL  Take 25 mg by mouth daily as needed for allergies.     fluticasone 50 MCG/ACT nasal spray  Commonly known as:  FLONASE     HYDROcodone-acetaminophen 5-325 MG per tablet  Commonly known as:  NORCO/VICODIN  Take 1 tablet by mouth every 6 (six) hours as needed for moderate pain.     lidocaine-prilocaine cream  Commonly known as:  EMLA  Apply 1 application topically as needed. 30 min prior to accessing port     omeprazole 40 MG capsule  Commonly known as:  PRILOSEC  Take 40 mg by mouth daily.     ondansetron 8 MG tablet  Commonly known as:  ZOFRAN  Take 1 tablet (8 mg total) by mouth 2 (two) times daily. Start the day after chemo for 3 days. Then as needed for nausea or vomiting.     polyethylene glycol packet  Commonly known as:  MIRALAX / GLYCOLAX  Take 17  g by mouth daily as needed for mild constipation.     PROBIOTIC DAILY PO  Take 1 capsule by mouth daily.     promethazine 12.5 MG tablet  Commonly known as:  PHENERGAN  Take 12.5 mg by mouth every 6 (six) hours as needed for nausea or vomiting.     Vitamin D-3 1000 UNITS Caps  Take 1 capsule by mouth daily.      ASK your doctor about these medications        fluticasone 27.5 MCG/SPRAY nasal spray  Commonly known as:  VERAMYST  Place 2  sprays into the nose 2 (two) times daily as needed for allergies.         SignedForest Gleason 09/30/2014, 9:55 PM

## 2014-10-07 ENCOUNTER — Ambulatory Visit
Admission: RE | Admit: 2014-10-07 | Discharge: 2014-10-07 | Disposition: A | Discharge status: Home / Self Care | Payer: BC Managed Care – PPO | Source: Ambulatory Visit | Attending: Oncology | Admitting: Oncology

## 2014-10-07 DIAGNOSIS — C8330 Diffuse large B-cell lymphoma, unspecified site: Secondary | ICD-10-CM | POA: Diagnosis not present

## 2014-10-07 LAB — MISC LABCORP TEST (SEND OUT)

## 2014-10-07 LAB — GLUCOSE, CAPILLARY: Glucose-Capillary: 97 mg/dL (ref 65–99)

## 2014-10-07 MED ORDER — FLUDEOXYGLUCOSE F - 18 (FDG) INJECTION
12.3000 | Freq: Once | INTRAVENOUS | Status: AC | PRN
  Administered 2014-10-07: 12.3 via INTRAVENOUS

## 2014-10-14 ENCOUNTER — Inpatient Hospital Stay: Payer: BC Managed Care – PPO | Attending: Oncology | Admitting: Oncology

## 2014-10-14 ENCOUNTER — Inpatient Hospital Stay: Payer: BC Managed Care – PPO

## 2014-10-14 ENCOUNTER — Encounter: Payer: Self-pay | Admitting: Oncology

## 2014-10-14 VITALS — BP 120/79 | HR 85 | Temp 98.1°F | Wt 164.9 lb

## 2014-10-14 DIAGNOSIS — K219 Gastro-esophageal reflux disease without esophagitis: Secondary | ICD-10-CM

## 2014-10-14 DIAGNOSIS — C8330 Diffuse large B-cell lymphoma, unspecified site: Secondary | ICD-10-CM

## 2014-10-14 DIAGNOSIS — K760 Fatty (change of) liver, not elsewhere classified: Secondary | ICD-10-CM | POA: Diagnosis not present

## 2014-10-14 DIAGNOSIS — C8333 Diffuse large B-cell lymphoma, intra-abdominal lymph nodes: Secondary | ICD-10-CM | POA: Diagnosis present

## 2014-10-14 DIAGNOSIS — R112 Nausea with vomiting, unspecified: Secondary | ICD-10-CM | POA: Diagnosis not present

## 2014-10-14 DIAGNOSIS — Z79899 Other long term (current) drug therapy: Secondary | ICD-10-CM | POA: Insufficient documentation

## 2014-10-14 DIAGNOSIS — R1030 Lower abdominal pain, unspecified: Secondary | ICD-10-CM | POA: Insufficient documentation

## 2014-10-14 DIAGNOSIS — R109 Unspecified abdominal pain: Secondary | ICD-10-CM | POA: Insufficient documentation

## 2014-10-14 DIAGNOSIS — R5383 Other fatigue: Secondary | ICD-10-CM | POA: Diagnosis not present

## 2014-10-14 DIAGNOSIS — Z5111 Encounter for antineoplastic chemotherapy: Secondary | ICD-10-CM | POA: Diagnosis not present

## 2014-10-14 DIAGNOSIS — R531 Weakness: Secondary | ICD-10-CM | POA: Insufficient documentation

## 2014-10-14 LAB — CBC WITH DIFFERENTIAL/PLATELET
BASOS ABS: 0 10*3/uL (ref 0–0.1)
BASOS PCT: 1 %
EOS PCT: 4 %
Eosinophils Absolute: 0.1 10*3/uL (ref 0–0.7)
HCT: 36.2 % (ref 35.0–47.0)
HEMOGLOBIN: 12.1 g/dL (ref 12.0–16.0)
Lymphocytes Relative: 15 %
Lymphs Abs: 0.4 10*3/uL — ABNORMAL LOW (ref 1.0–3.6)
MCH: 30.1 pg (ref 26.0–34.0)
MCHC: 33.5 g/dL (ref 32.0–36.0)
MCV: 89.9 fL (ref 80.0–100.0)
MONO ABS: 0.4 10*3/uL (ref 0.2–0.9)
MONOS PCT: 16 %
NEUTROS ABS: 1.6 10*3/uL (ref 1.4–6.5)
Neutrophils Relative %: 64 %
Platelets: 229 10*3/uL (ref 150–440)
RBC: 4.02 MIL/uL (ref 3.80–5.20)
RDW: 16.2 % — AB (ref 11.5–14.5)
WBC: 2.6 10*3/uL — ABNORMAL LOW (ref 3.6–11.0)

## 2014-10-14 LAB — URINALYSIS, DIPSTICK ONLY
BILIRUBIN URINE: NEGATIVE
Bilirubin Urine: NEGATIVE
Bilirubin Urine: NEGATIVE
Bilirubin Urine: NEGATIVE
GLUCOSE, UA: NEGATIVE mg/dL
GLUCOSE, UA: NEGATIVE mg/dL
Glucose, UA: NEGATIVE mg/dL
Glucose, UA: NEGATIVE mg/dL
Hgb urine dipstick: NEGATIVE
Hgb urine dipstick: NEGATIVE
Hgb urine dipstick: NEGATIVE
Hgb urine dipstick: NEGATIVE
KETONES UR: NEGATIVE mg/dL
KETONES UR: NEGATIVE mg/dL
KETONES UR: NEGATIVE mg/dL
Ketones, ur: NEGATIVE mg/dL
LEUKOCYTES UA: NEGATIVE
Leukocytes, UA: NEGATIVE
Leukocytes, UA: NEGATIVE
Leukocytes, UA: NEGATIVE
NITRITE: NEGATIVE
NITRITE: NEGATIVE
Nitrite: NEGATIVE
Nitrite: NEGATIVE
PH: 9 — AB (ref 5.0–8.0)
PH: 8 (ref 5.0–8.0)
PH: 8 (ref 5.0–8.0)
PROTEIN: NEGATIVE mg/dL
PROTEIN: NEGATIVE mg/dL
PROTEIN: NEGATIVE mg/dL
PROTEIN: NEGATIVE mg/dL
SPECIFIC GRAVITY, URINE: 1.015 (ref 1.005–1.030)
Specific Gravity, Urine: 1.012 (ref 1.005–1.030)
Specific Gravity, Urine: 1.006 (ref 1.005–1.030)
Specific Gravity, Urine: 1.006 (ref 1.005–1.030)
pH: 9 — ABNORMAL HIGH (ref 5.0–8.0)

## 2014-10-14 LAB — COMPREHENSIVE METABOLIC PANEL
ALT: 17 U/L (ref 14–54)
AST: 22 U/L (ref 15–41)
Albumin: 3.7 g/dL (ref 3.5–5.0)
Alkaline Phosphatase: 85 U/L (ref 38–126)
Anion gap: 7 (ref 5–15)
BUN: 26 mg/dL — ABNORMAL HIGH (ref 6–20)
CALCIUM: 8.6 mg/dL — AB (ref 8.9–10.3)
CO2: 25 mmol/L (ref 22–32)
CREATININE: 0.78 mg/dL (ref 0.44–1.00)
Chloride: 102 mmol/L (ref 101–111)
GFR calc Af Amer: >60 mL/min (ref >60)
GLUCOSE: 123 mg/dL — AB (ref 65–99)
Potassium: 3.4 mmol/L — ABNORMAL LOW (ref 3.5–5.1)
SODIUM: 134 mmol/L — AB (ref 135–145)
TOTAL PROTEIN: 6.2 g/dL — AB (ref 6.5–8.1)
Total Bilirubin: 0.8 mg/dL (ref 0.3–1.2)

## 2014-10-14 LAB — LACTATE DEHYDROGENASE: LDH: 178 U/L (ref 98–192)

## 2014-10-14 NOTE — Progress Notes (Signed)
Patient does not have liviing will.  Does not smoke.

## 2014-10-14 NOTE — Progress Notes (Signed)
[place] @ [facility] [phone]  Fax:(336) [place] OB: 08/23/1955  MR#: [id number]  KMM#:[id number]  Patient Care Team: Nicoletta Dress, MD as PCP - General (Internal Medicine) Christene Lye, MD (General Surgery) Margarita Rana, MD as Referring Physician (Family Medicine)  CHIEF COMPLAINT:  Chief Complaint  Patient presents with  . Follow-up    Oncology History   1.abnormal PET scan and CT scan Abdominal mass biopsy on 11th of January is consistent with diffuse large cell lymphoma B-cell type Fish panel was negative for any double hit  lymphoma 2.LDH is 276 3.Started on chemotherapy with R CHOP allopurinol was started few days before (May 14, 2014) patient received high-dose methotrexate after cycle 2 4, PET scan has been reviewed (April,18 th 2016) reported to be having excellent respons 5.  Patient has finished total 6 cycles of chemotherapy with R CHOP and high-dose methotrexate on the June of 2016 PET scan shows good response however still persistent disease.  (October 14, 2014)     Diffuse large B cell lymphoma   09/05/2014 Initial Diagnosis Diffuse large B cell lymphoma    Oncology Flowsheet 09/24/2014 09/25/2014 09/25/2014 09/26/2014 09/26/2014 09/26/2014 09/27/2014  Day, Cycle - - - - - - -  cyclophosphamide (CYTOXAN) IV - - - - - - -  dexamethasone (DECADRON) IV 10 mg - - - - - -  diphenhydrAMINE (BENADRYL) PO - - - - - - -  DOXOrubicin (ADRIAMYCIN) IV - - - - - - -  leucovorin (WELLCOVORIN) IV - [ 26 mg ] [ 26 mg ] [ 26 mg ] [ 26 mg ] [ 26 mg ] [ 26 mg ]  methotrexate (PF) IV 6,000 mg - - - - - -  ondansetron (ZOFRAN) IV 12 mg - - - - - -  pegfilgrastim (NEULASTA ONPRO KIT) [place] - - - - - - -  riTUXimab (RITUXAN) IV - - - - - - -  vinCRIStine (ONCOVIN) IV - - - - - - -    INTERVAL HISTORY: 59 year old lady with diffuse large B-cell lymphoma here to initiate 6 cycle of chemotherapy.  Patient has tolerated treatment very well.  Has  occasional abdominal discomfort.  No nausea no vomiting.  No soreness in the mouth.  No tingling numbness.  Patient is also due for high-dose methotrexate third and the last treatment. October 14, 2014 Patient continues to have some lower abdominal discomfort.  No nausea no vomiting.  No fever.  Appetite has been fairly stable.  Patient had a repeat PET scan.  Here to review scan and further planning of treatment  REVIEW OF SYSTEMS:   GENERAL:  Feels good.  Active.  No fevers, sweats or weight loss. PERFORMANCE STATUS (ECOG):  0 HEENT:  No visual changes, runny nose, sore throat, mouth sores or tenderness. Lungs: No shortness of breath or cough.  No hemoptysis. Cardiac:  No chest pain, palpitations, orthopnea, or PND. GI:  No nausea, vomiting, diarrhea, constipation, melena or hematochezia. GU:  No urgency, frequency, dysuria, or hematuria. Musculoskeletal:  No back pain.  No joint pain.  No muscle tenderness. Extremities:  No pain or swelling. Skin:  No rashes or skin changes. Neuro:  No headache, numbness or weakness, balance or coordination issues. Endocrine:  No diabetes, thyroid issues, hot flashes or night sweats. Psych:  No mood changes, depression or anxiety. Pain:  No focal pain. Review of systems:  All other systems reviewed and found to be negative.  As per HPI. Otherwise, a complete review of systems is negatve.  PAST MEDICAL HISTORY: Past Medical History  Diagnosis Date  . GERD (gastroesophageal reflux disease)   . 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 Anemia   . Fatty liver unknown  . 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 Upper gi endoscopy  2004    Dr Vira Agar  . Colonoscopy  2012    Dr. Vira Agar    FAMILY HISTORY Family History  Problem Relation Age of Onset  . Cancer Father     bone/brain        ADVANCED DIRECTIVES: Does not have any advance care directive   HEALTH  MAINTENANCE: History  Substance Use Topics  . Smoking status: Never Smoker   . Smokeless tobacco: Not on file  . Alcohol Use: No     Comment: 4/week     Allergies  Allergen Reactions  . Pollen Extract Itching    Current Outpatient Prescriptions  Medication Sig Dispense Refill  . acetaminophen (TYLENOL) 500 MG tablet Take 500 mg by mouth every 6 (six) hours as needed for mild pain.     . Cholecalciferol (VITAMIN D-3) 1000 UNITS CAPS Take 1 capsule by mouth daily.     . citalopram (CELEXA) 20 MG tablet Take 20 mg by mouth daily.    . fluticasone (FLONASE) 50 MCG/ACT nasal spray   3  . lidocaine-prilocaine (EMLA) cream Apply 1 application topically as needed. 30 min prior to accessing port 30 g 3  . omeprazole (PRILOSEC) 40 MG capsule Take 40 mg by mouth daily.    . polyethylene glycol (MIRALAX / GLYCOLAX) packet Take 17 g by mouth daily as needed for mild constipation.     . Probiotic Product (PROBIOTIC DAILY PO) Take 1 capsule by mouth daily.    . Turmeric (RA TURMERIC) 500 MG CAPS Take by mouth 2 (two) times daily.    . diphenhydrAMINE (BENADRYL) 25 MG tablet Take 25 mg by mouth daily as needed for allergies.    . fluticasone (VERAMYST) 27.5 MCG/SPRAY nasal spray Place 2 sprays into the nose 2 (two) times daily as needed for allergies.     Marland Kitchen HYDROcodone-acetaminophen (NORCO/VICODIN) 5-325 MG per tablet Take 1 tablet by mouth every 6 (six) hours as needed for moderate pain.    Marland Kitchen ondansetron (ZOFRAN) 8 MG tablet Take 1 tablet (8 mg total) by mouth 2 (two) times daily. Start the day after chemo for 3 days. Then as needed for nausea or vomiting. (Patient not taking: Reported on 10/14/2014) 30 tablet 1  . promethazine (PHENERGAN) 12.5 MG tablet Take 12.5 mg by mouth every 6 (six) hours as needed for nausea or vomiting.     No current facility-administered medications for this visit.    OBJECTIVE:  Filed Vitals:   10/14/14 0951  BP: 120/79  Pulse: 85  Temp: 98.1 F (36.7 C)      Body mass index is 27.44 kg/(m^2).    ECOG FS:0 - Asymptomatic  PHYSICAL EXAM: General  status: Performance status is good.  Patient has not lost significant weight HEENT: No evidence of stomatitis.  And alopecia Sclera and conjunctivae :: No jaundice.   pale looking . Lungs: Air  entry equal on both sides.  No rhonchi.  No rales.  Cardiac: Heart sounds are normal.  No pericardial rub.  No murmur. Lymphatic system: Cervical, axillary, inguinal, lymph nodes not palpable GI: Abdomen  is soft.  No ascites.  Liver spleen not palpable.  No tenderness.  Bowel sounds are within normal limit Lower extremity: No edema Neurological system: Higher functions, cranial nerves intact No evidence of peripheral neuropathy. Skin: No rash.  No ecchymosis.Marland Kitchen     LAB RESULTS:  Appointment on 10/14/2014  Component Date Value Ref Range Status  . WBC 10/14/2014 2.6* 3.6 - 11.0 K/uL Final  . RBC 10/14/2014 4.02  3.80 - 5.20 MIL/uL Final  . Hemoglobin 10/14/2014 12.1  12.0 - 16.0 g/dL Final  . HCT 10/14/2014 36.2  35.0 - 47.0 % Final  . MCV 10/14/2014 89.9  80.0 - 100.0 fL Final  . MCH 10/14/2014 30.1  26.0 - 34.0 pg Final  . MCHC 10/14/2014 33.5  32.0 - 36.0 g/dL Final  . RDW 10/14/2014 16.2* 11.5 - 14.5 % Final  . Platelets 10/14/2014 229  150 - 440 K/uL Final  . Neutrophils Relative % 10/14/2014 64   Final  . Neutro Abs 10/14/2014 1.6  1.4 - 6.5 K/uL Final  . Lymphocytes Relative 10/14/2014 15   Final  . Lymphs Abs 10/14/2014 0.4* 1.0 - 3.6 K/uL Final  . Monocytes Relative 10/14/2014 16   Final  . Monocytes Absolute 10/14/2014 0.4  0.2 - 0.9 K/uL Final  . Eosinophils Relative 10/14/2014 4   Final  . Eosinophils Absolute 10/14/2014 0.1  0 - 0.7 K/uL Final  . Basophils Relative 10/14/2014 1   Final  . Basophils Absolute 10/14/2014 0.0  0 - 0.1 K/uL Final  . Sodium 10/14/2014 134* 135 - 145 mmol/L Final  . Potassium 10/14/2014 3.4* 3.5 - 5.1 mmol/L Final  . Chloride 10/14/2014 102  101 - 111 mmol/L  Final  . CO2 10/14/2014 25  22 - 32 mmol/L Final  . Glucose, Bld 10/14/2014 123* 65 - 99 mg/dL Final  . BUN 10/14/2014 26* 6 - 20 mg/dL Final  . Creatinine, Ser 10/14/2014 0.78  0.44 - 1.00 mg/dL Final  . Calcium 10/14/2014 8.6* 8.9 - 10.3 mg/dL Final  . Total Protein 10/14/2014 6.2* 6.5 - 8.1 g/dL Final  . Albumin 10/14/2014 3.7  3.5 - 5.0 g/dL Final  . AST 10/14/2014 22  15 - 41 U/L Final  . ALT 10/14/2014 17  14 - 54 U/L Final  . Alkaline Phosphatase 10/14/2014 85  38 - 126 U/L Final  . Total Bilirubin 10/14/2014 0.8  0.3 - 1.2 mg/dL Final  . GFR calc non Af Amer 10/14/2014 >60  >60 mL/min Final  . GFR calc Af Amer 10/14/2014 >60  >60 mL/min Final   Comment: (NOTE) The eGFR has been calculated using the CKD EPI equation. This calculation has not been validated in all clinical situations. eGFR's persistently <60 mL/min signify possible Chronic Kidney Disease.   . Anion gap 10/14/2014 7  5 - 15 Final  . LDH 10/14/2014 178  98 - 192 U/L Final    No results found for: LABCA2 No results found for: CA199 Lab Results  Component Value Date   CEA <0.3 04/16/2014   No results found for: PSA Lab Results  Component Value Date   CA125 47.5* 04/16/2014     STUDIES: Nm Pet Image Restag (ps) Skull Base To Thigh  10/07/2014   CLINICAL DATA:  Subsequent treatment strategy for diffuse large B-cell lymphoma.  EXAM: NUCLEAR MEDICINE PET SKULL BASE TO THIGH  TECHNIQUE: 12.3 mCi F-18 FDG was injected intravenously. Full-ring PET imaging was performed from the skull base to thigh after the radiotracer. CT  data was obtained and used for attenuation correction and anatomic localization.  FASTING BLOOD GLUCOSE:  Value: 97 mg/dl  COMPARISON:  PET CTs 08/11/2014 and 04/24/2014  FINDINGS: NECK  No hypermetabolic lymph nodes in the neck.  CHEST  No hypermetabolic mediastinal or hilar nodes. No suspicious pulmonary nodules on the CT scan.  ABDOMEN/PELVIS  Persistent area of ill-defined small bowel wall  thickening in the mid left abdomen. This is hypermetabolic with SUV max of 2.16. On the prior study this was 3.38. No adjacent enlarged or hypermetabolic lymph nodes are identified. Mildly dilated small bowel loops. No pelvic lymphadenopathy. No inguinal adenopathy.  SKELETON  No focal hypermetabolic activity to suggest skeletal metastasis.  IMPRESSION: 1. Persistent area of ill-defined small bowel wall thickening in the left mid abdomen. This remains hypermetabolic and has slightly increased when compared to the prior study. Findings suggest persistent tumor. 2. No new or recurrent enlarged or hypermetabolic lymph nodes in the chest, abdomen or pelvis.   Electronically Signed   By: Marijo Sanes M.D.   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 I discussed situation with [facility] physician was also reviewed PET scan.  I believe there is persistent tumor and may be showing slightly more activity but I did not see any new areas Bone marrow transplant team suggestion was to initiate rice chemotherapy x2 followed by high-dose chemotherapy I discussed these findings with the patient. We will proceed with at least getting approval for rice treatment After first treatment will make an appointment for bone marrow transplant team to do pre-transplant workup Total duration of visit was 50  minutes.  50% or more time was spent in counseling patient and family regarding prognosis and options of treatment and available resources Patient expressed understanding and was in agreement with this plan. She also understands that She can call clinic at any time with any questions, concerns, or complaints.    Diffuse large B cell lymphoma   Staging form: Lymphoid Neoplasms, AJCC 6th Edition     Clinical: Stage III - April Griffon, MD   10/14/2014 1:38 PM

## 2014-10-16 ENCOUNTER — Telehealth: Payer: Self-pay | Admitting: *Deleted

## 2014-10-16 NOTE — Telephone Encounter (Signed)
Pt informed per Dr Oliva Bustard no steroids with the new therapy. Left message on vm

## 2014-10-20 ENCOUNTER — Inpatient Hospital Stay: Payer: BC Managed Care – PPO

## 2014-10-20 ENCOUNTER — Inpatient Hospital Stay (HOSPITAL_BASED_OUTPATIENT_CLINIC_OR_DEPARTMENT_OTHER): Payer: BC Managed Care – PPO | Admitting: Oncology

## 2014-10-20 ENCOUNTER — Encounter: Payer: Self-pay | Admitting: Oncology

## 2014-10-20 VITALS — BP 118/82 | HR 103 | Temp 97.1°F | Wt 157.2 lb

## 2014-10-20 DIAGNOSIS — K219 Gastro-esophageal reflux disease without esophagitis: Secondary | ICD-10-CM

## 2014-10-20 DIAGNOSIS — R5383 Other fatigue: Secondary | ICD-10-CM

## 2014-10-20 DIAGNOSIS — C8333 Diffuse large B-cell lymphoma, intra-abdominal lymph nodes: Secondary | ICD-10-CM | POA: Diagnosis not present

## 2014-10-20 DIAGNOSIS — R531 Weakness: Secondary | ICD-10-CM | POA: Diagnosis not present

## 2014-10-20 DIAGNOSIS — K760 Fatty (change of) liver, not elsewhere classified: Secondary | ICD-10-CM

## 2014-10-20 DIAGNOSIS — C8330 Diffuse large B-cell lymphoma, unspecified site: Secondary | ICD-10-CM

## 2014-10-20 DIAGNOSIS — R112 Nausea with vomiting, unspecified: Secondary | ICD-10-CM

## 2014-10-20 DIAGNOSIS — R109 Unspecified abdominal pain: Secondary | ICD-10-CM | POA: Diagnosis not present

## 2014-10-20 DIAGNOSIS — Z79899 Other long term (current) drug therapy: Secondary | ICD-10-CM

## 2014-10-20 LAB — COMPREHENSIVE METABOLIC PANEL
ALBUMIN: 4.1 g/dL (ref 3.5–5.0)
ALT: 16 U/L (ref 14–54)
ANION GAP: 8 (ref 5–15)
AST: 24 U/L (ref 15–41)
Alkaline Phosphatase: 85 U/L (ref 38–126)
BILIRUBIN TOTAL: 1.5 mg/dL — AB (ref 0.3–1.2)
BUN: 19 mg/dL (ref 6–20)
CO2: 26 mmol/L (ref 22–32)
CREATININE: 0.79 mg/dL (ref 0.44–1.00)
Calcium: 9.2 mg/dL (ref 8.9–10.3)
Chloride: 101 mmol/L (ref 101–111)
GFR calc Af Amer: >60 mL/min (ref >60)
GFR calc non Af Amer: >60 mL/min (ref >60)
Glucose, Bld: 142 mg/dL — ABNORMAL HIGH (ref 65–99)
Potassium: 3.8 mmol/L (ref 3.5–5.1)
Sodium: 135 mmol/L (ref 135–145)
TOTAL PROTEIN: 7 g/dL (ref 6.5–8.1)

## 2014-10-20 LAB — LACTATE DEHYDROGENASE: LDH: 159 U/L (ref 98–192)

## 2014-10-20 LAB — CBC WITH DIFFERENTIAL/PLATELET
BASOS PCT: 1 %
Basophils Absolute: 0 10*3/uL (ref 0–0.1)
EOS ABS: 0.1 10*3/uL (ref 0–0.7)
Eosinophils Relative: 1 %
HEMATOCRIT: 39.3 % (ref 35.0–47.0)
HEMOGLOBIN: 13.3 g/dL (ref 12.0–16.0)
LYMPHS ABS: 0.9 10*3/uL — AB (ref 1.0–3.6)
Lymphocytes Relative: 19 %
MCH: 29.9 pg (ref 26.0–34.0)
MCHC: 33.7 g/dL (ref 32.0–36.0)
MCV: 88.9 fL (ref 80.0–100.0)
MONO ABS: 1.1 10*3/uL — AB (ref 0.2–0.9)
MONOS PCT: 23 %
NEUTROS ABS: 2.6 10*3/uL (ref 1.4–6.5)
Neutrophils Relative %: 56 %
Platelets: 308 10*3/uL (ref 150–440)
RBC: 4.43 MIL/uL (ref 3.80–5.20)
RDW: 15.4 % — ABNORMAL HIGH (ref 11.5–14.5)
WBC: 4.8 10*3/uL (ref 3.6–11.0)

## 2014-10-20 LAB — MAGNESIUM: MAGNESIUM: 2 mg/dL (ref 1.7–2.4)

## 2014-10-20 MED ORDER — SODIUM CHLORIDE 0.9 % IJ SOLN
10.0000 mL | INTRAMUSCULAR | Status: DC | PRN
  Filled 2014-10-20: qty 10

## 2014-10-20 MED ORDER — DIPHENHYDRAMINE HCL 25 MG PO CAPS
50.0000 mg | ORAL_CAPSULE | Freq: Once | ORAL | Status: AC
  Administered 2014-10-20: 50 mg via ORAL
  Filled 2014-10-20: qty 2

## 2014-10-20 MED ORDER — SODIUM CHLORIDE 0.9 % IV SOLN
INTRAVENOUS | Status: DC
  Administered 2014-10-20: 10:00:00 via INTRAVENOUS
  Filled 2014-10-20: qty 1000

## 2014-10-20 MED ORDER — ACETAMINOPHEN 325 MG PO TABS
650.0000 mg | ORAL_TABLET | Freq: Once | ORAL | Status: AC
Start: 2014-10-20 — End: 2014-10-20
  Administered 2014-10-20: 650 mg via ORAL
  Filled 2014-10-20: qty 2

## 2014-10-20 MED ORDER — SODIUM CHLORIDE 0.9 % IV SOLN
375.0000 mg/m2 | Freq: Once | INTRAVENOUS | Status: AC
  Administered 2014-10-20: 700 mg via INTRAVENOUS
  Filled 2014-10-20: qty 60

## 2014-10-20 MED ORDER — SODIUM CHLORIDE 0.9 % IV SOLN
80.0000 mg/m2 | Freq: Once | INTRAVENOUS | Status: AC
  Administered 2014-10-20: 150 mg via INTRAVENOUS
  Filled 2014-10-20: qty 7.5

## 2014-10-20 MED ORDER — SODIUM CHLORIDE 0.9 % IV SOLN
Freq: Once | INTRAVENOUS | Status: AC
  Administered 2014-10-20: 16 mg via INTRAVENOUS
  Filled 2014-10-20: qty 8

## 2014-10-20 MED ORDER — HEPARIN SOD (PORK) LOCK FLUSH 100 UNIT/ML IV SOLN
500.0000 [IU] | Freq: Once | INTRAVENOUS | Status: AC | PRN
  Administered 2014-10-20: 500 [IU]
  Filled 2014-10-20: qty 5

## 2014-10-20 NOTE — Progress Notes (Signed)
[place] @ [facility] Telephone:(336) [phone]  Fax:(336) [place] OB: [birth year]  MR#: [id number]  HWY#:[id number]  Patient Care Team: Nicoletta Dress, MD as PCP - General (Internal Medicine) Christene Lye, MD (General Surgery) Margarita Rana, MD as Referring Physician (Family Medicine)  CHIEF COMPLAINT:  Chief Complaint  Patient presents with  . Follow-up      Oncology Flowsheet 09/25/2014 09/25/2014 09/26/2014 09/26/2014 09/26/2014 09/27/2014 10/20/2014  Day, Cycle - - - - - - Day 1, Cycle 1  cyclophosphamide (CYTOXAN) IV - - - - - - -  dexamethasone (DECADRON) IV - - - - - - [ 20 mg ]  diphenhydrAMINE (BENADRYL) PO - - - - - - 50 mg  DOXOrubicin (ADRIAMYCIN) IV - - - - - - -  etoposide (VEPESID) IV - - - - - - 80 mg/m2  leucovorin (WELLCOVORIN) IV [ 26 mg ] [ 26 mg ] [ 26 mg ] [ 26 mg ] [ 26 mg ] [ 26 mg ] -  methotrexate (PF) IV - - - - - - -  ondansetron (ZOFRAN) IV - - - - - - [ 16 mg ]  pegfilgrastim (NEULASTA ONPRO KIT)  - - - - - - -  riTUXimab (RITUXAN) IV - - - - - - 375 mg/m2  vinCRIStine (ONCOVIN) IV - - - - - - -    INTERVAL HISTORY: 59 year old lady with diffuse large B-cell lymphoma here to initiate 6 cycle of chemotherapy.  Patient has tolerated treatment very well.  Has occasional abdominal discomfort.  No nausea no vomiting.  No soreness in the mouth.  No tingling numbness.  Patient is also due for high-dose methotrexate third and the last treatment. October 14, 2014 Patient continues to have some lower abdominal discomfort.  No nausea no vomiting.  No fever.  Appetite has been fairly stable.  Patient had a repeat PET scan.  Here to review scan and further planning of treatment October 20, 2014 Patient comes here with increasing abdominal pain and discomfort persistent nausea or vomiting patient is feeling weak and tired.  Patient is here to initiate next cycle of chemotherapy with RICE   REVIEW OF SYSTEMS:   GENERAL:  Feels good.  Active.  No  fevers, sweats or weight loss. PERFORMANCE STATUS (ECOG):  0 HEENT:  No visual changes, runny nose, sore throat, mouth sores or tenderness. Lungs: No shortness of breath or cough.  No hemoptysis. Cardiac:  No chest pain, palpitations, orthopnea, or PND. GI:  Increasing abdominal pain and discomfort.  Persistent nausea and vomiting.  GU:  No urgency, frequency, dysuria, or hematuria. Musculoskeletal:  No back pain.  No joint pain.  No muscle tenderness. Extremities:  No pain or swelling. Skin:  No rashes or skin changes. Neuro:  No headache, numbness or weakness, balance or coordination issues. Endocrine:  No diabetes, thyroid issues, hot flashes or night sweats. Psych:  No mood changes, depression or anxiety. Pain: Abdominal pain grade 2 on narcotics instruction regarding constipation was given Review of systems:  All other systems reviewed and found to be negative.  As per HPI. Otherwise, a complete review of systems is negatve.  PAST MEDICAL HISTORY: Past Medical History  Diagnosis Date  . GERD (gastroesophageal reflux disease)   . 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 Anemia   . Fatty liver unknown  . Diffuse large B cell lymphoma 09/05/2014    PAST SURGICAL HISTORY: Past Surgical History  Procedure  Laterality Date  . Upper gi endoscopy  2004    Dr Vira Agar  . Colonoscopy  2012    Dr. Vira Agar    FAMILY HISTORY Family History  Problem Relation Age of Onset  . Cancer Father     bone/brain        ADVANCED DIRECTIVES: Does not have any advance care directive   HEALTH MAINTENANCE: History  Substance Use Topics  . Smoking status: Never Smoker   . Smokeless tobacco: Not on file  . Alcohol Use: No     Comment: 4/week     Allergies  Allergen Reactions  . Pollen Extract Itching    Current Outpatient Prescriptions  Medication Sig Dispense Refill  . acetaminophen (TYLENOL) 500 MG tablet Take 500 mg by mouth every 6 (six) hours as needed for mild pain.       . Cholecalciferol (VITAMIN D-3) 1000 UNITS CAPS Take 1 capsule by mouth daily.     . citalopram (CELEXA) 20 MG tablet Take 20 mg by mouth daily.    . diphenhydrAMINE (BENADRYL) 25 MG tablet Take 25 mg by mouth daily as needed for allergies.    . fluticasone (FLONASE) 50 MCG/ACT nasal spray   3  . fluticasone (VERAMYST) 27.5 MCG/SPRAY nasal spray Place 2 sprays into the nose 2 (two) times daily as needed for allergies.     Marland Kitchen HYDROcodone-acetaminophen (NORCO/VICODIN) 5-325 MG per tablet Take 1 tablet by mouth every 6 (six) hours as needed for moderate pain.    Marland Kitchen lidocaine-prilocaine (EMLA) cream Apply 1 application topically as needed. 30 min prior to accessing port 30 g 3  . omeprazole (PRILOSEC) 40 MG capsule Take 40 mg by mouth daily.    . ondansetron (ZOFRAN) 8 MG tablet Take 1 tablet (8 mg total) by mouth 2 (two) times daily. Start the day after chemo for 3 days. Then as needed for nausea or vomiting. 30 tablet 1  . polyethylene glycol (MIRALAX / GLYCOLAX) packet Take 17 g by mouth daily as needed for mild constipation.     . Probiotic Product (PROBIOTIC DAILY PO) Take 1 capsule by mouth daily.    . Turmeric (RA TURMERIC) 500 MG CAPS Take by mouth 2 (two) times daily.    . promethazine (PHENERGAN) 12.5 MG tablet Take 12.5 mg by mouth every 6 (six) hours as needed for nausea or vomiting.     No current facility-administered medications for this visit.    OBJECTIVE:  Filed Vitals:   10/20/14 0836  BP: 118/82  Pulse: 103  Temp: 97.1 F (36.2 C)     Body mass index is 26.16 kg/(m^2).    ECOG FS:0 - Asymptomatic  PHYSICAL EXAM: General  status: Performance status is good.  Patient has not lost significant weight HEENT: No evidence of stomatitis.  And alopecia Sclera and conjunctivae :: No jaundice.   pale looking . Lungs: Air  entry equal on both sides.  No rhonchi.  No rales.  Cardiac: Heart sounds are normal.  No pericardial rub.  No murmur. Lymphatic system: Cervical, axillary,  inguinal, lymph nodes not palpable Neurological system: Higher functions, cranial nerves intact No evidence of peripheral neuropathy. Skin: No rash.  No ecchymosis.. ICardiac exam revealed the PMI to be normally situated and sized. The rhythm was regular and no extrasystoles were noted during several minutes of auscultation. The first and second heart sounds were normal and physiologic splitting of the second heart sound was noted. There were no murmurs, rubs, clicks, or gallops. Lower extremities  no swelling     LAB RESULTS:  Infusion on 10/20/2014  Component Date Value Ref Range Status  . Sodium 10/20/2014 135  135 - 145 mmol/L Final  . Potassium 10/20/2014 3.8  3.5 - 5.1 mmol/L Final  . Chloride 10/20/2014 101  101 - 111 mmol/L Final  . CO2 10/20/2014 26  22 - 32 mmol/L Final  . Glucose, Bld 10/20/2014 142* 65 - 99 mg/dL Final  . BUN 10/20/2014 19  6 - 20 mg/dL Final  . Creatinine, Ser 10/20/2014 0.79  0.44 - 1.00 mg/dL Final  . Calcium 10/20/2014 9.2  8.9 - 10.3 mg/dL Final  . Total Protein 10/20/2014 7.0  6.5 - 8.1 g/dL Final  . Albumin 10/20/2014 4.1  3.5 - 5.0 g/dL Final  . AST 10/20/2014 24  15 - 41 U/L Final  . ALT 10/20/2014 16  14 - 54 U/L Final  . Alkaline Phosphatase 10/20/2014 85  38 - 126 U/L Final  . Total Bilirubin 10/20/2014 1.5* 0.3 - 1.2 mg/dL Final  . GFR calc non Af Amer 10/20/2014 >60  >60 mL/min Final  . GFR calc Af Amer 10/20/2014 >60  >60 mL/min Final   Comment: (NOTE) The eGFR has been calculated using the CKD EPI equation. This calculation has not been validated in all clinical situations. eGFR's persistently <60 mL/min signify possible Chronic Kidney Disease.   . Anion gap 10/20/2014 8  5 - 15 Final  . LDH 10/20/2014 159  98 - 192 U/L Final  . Magnesium 10/20/2014 2.0  1.7 - 2.4 mg/dL Final  . WBC 10/20/2014 4.8  3.6 - 11.0 K/uL Final   A-LINE DRAW  . RBC 10/20/2014 4.43  3.80 - 5.20 MIL/uL Final  . Hemoglobin 10/20/2014 13.3  12.0 - 16.0 g/dL  Final  . HCT 10/20/2014 39.3  35.0 - 47.0 % Final  . MCV 10/20/2014 88.9  80.0 - 100.0 fL Final  . MCH 10/20/2014 29.9  26.0 - 34.0 pg Final  . MCHC 10/20/2014 33.7  32.0 - 36.0 g/dL Final  . RDW 10/20/2014 15.4* 11.5 - 14.5 % Final  . Platelets 10/20/2014 308  150 - 440 K/uL Final  . Neutrophils Relative % 10/20/2014 56   Final  . Neutro Abs 10/20/2014 2.6  1.4 - 6.5 K/uL Final  . Lymphocytes Relative 10/20/2014 19   Final  . Lymphs Abs 10/20/2014 0.9* 1.0 - 3.6 K/uL Final  . Monocytes Relative 10/20/2014 23   Final  . Monocytes Absolute 10/20/2014 1.1* 0.2 - 0.9 K/uL Final  . Eosinophils Relative 10/20/2014 1   Final  . Eosinophils Absolute 10/20/2014 0.1  0 - 0.7 K/uL Final  . Basophils Relative 10/20/2014 1   Final  . Basophils Absolute 10/20/2014 0.0  0 - 0.1 K/uL Final    No results found for: LABCA2 No results found for: CA199 Lab Results  Component Value Date   CEA <0.3 04/16/2014   No results found for: PSA Lab Results  Component Value Date   CA125 47.5* 04/16/2014     STUDIES: Nm Pet Image Restag (ps) Skull Base To Thigh  10/07/2014   CLINICAL DATA:  Subsequent treatment strategy for diffuse large B-cell lymphoma.  EXAM: NUCLEAR MEDICINE PET SKULL BASE TO THIGH  TECHNIQUE: 12.3 mCi F-18 FDG was injected intravenously. Full-ring PET imaging was performed from the skull base to thigh after the radiotracer. CT data was obtained and used for attenuation correction and anatomic localization.  FASTING BLOOD GLUCOSE:  Value: 97 mg/dl  COMPARISON:  PET CTs 08/11/2014 and 04/24/2014  FINDINGS: NECK  No hypermetabolic lymph nodes in the neck.  CHEST  No hypermetabolic mediastinal or hilar nodes. No suspicious pulmonary nodules on the CT scan.  ABDOMEN/PELVIS  Persistent area of ill-defined small bowel wall thickening in the mid left abdomen. This is hypermetabolic with SUV max of 7.56. On the prior study this was 3.38. No adjacent enlarged or hypermetabolic lymph nodes are  identified. Mildly dilated small bowel loops. No pelvic lymphadenopathy. No inguinal adenopathy.  SKELETON  No focal hypermetabolic activity to suggest skeletal metastasis.  IMPRESSION: 1. Persistent area of ill-defined small bowel wall thickening in the left mid abdomen. This remains hypermetabolic and has slightly increased when compared to the prior study. Findings suggest persistent tumor. 2. No new or recurrent enlarged or hypermetabolic lymph nodes in the chest, abdomen or pelvis.   Electronically Signed   By: Marijo Sanes M.D.   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 Clinically patient is having increasing abdominal discomfort.  We will proceed with chemotherapy with RICE Total duration of visit was 50  minutes.  50% or more time was spent in counseling patient and family regarding prognosis and options of treatment and available resources Patient expressed understanding and was in agreement with this plan. She also understands that She can call clinic at any time with any questions, concerns, or complaints.    Diffuse large B cell lymphoma   Staging form: Lymphoid Neoplasms, AJCC 6th Edition     Clinical: Stage III - April Griffon, MD   10/20/2014 9:55 PM

## 2014-10-20 NOTE — Progress Notes (Signed)
Patient does not have living will.  Never smoked. Pt had nausea and vomiting over the weekend.  States abdomen hurt a lot.

## 2014-10-21 ENCOUNTER — Inpatient Hospital Stay: Payer: BC Managed Care – PPO

## 2014-10-21 VITALS — BP 103/69 | HR 84 | Temp 96.8°F

## 2014-10-21 DIAGNOSIS — C8330 Diffuse large B-cell lymphoma, unspecified site: Secondary | ICD-10-CM

## 2014-10-21 DIAGNOSIS — C8333 Diffuse large B-cell lymphoma, intra-abdominal lymph nodes: Secondary | ICD-10-CM | POA: Diagnosis not present

## 2014-10-21 MED ORDER — SODIUM CHLORIDE 0.9 % IV SOLN
Freq: Once | INTRAVENOUS | Status: AC
  Administered 2014-10-21: 13:00:00 via INTRAVENOUS
  Filled 2014-10-21: qty 148

## 2014-10-21 MED ORDER — PALONOSETRON HCL INJECTION 0.25 MG/5ML
0.2500 mg | Freq: Once | INTRAVENOUS | Status: AC
Start: 2014-10-21 — End: 2014-10-21
  Administered 2014-10-21: 0.25 mg via INTRAVENOUS
  Filled 2014-10-21: qty 5

## 2014-10-21 MED ORDER — SODIUM CHLORIDE 0.9 % IV SOLN: Freq: Once | INTRAVENOUS | Status: DC

## 2014-10-21 MED ORDER — SODIUM CHLORIDE 0.9 % IV SOLN
Freq: Once | INTRAVENOUS | Status: AC
  Administered 2014-10-21: 09:00:00 via INTRAVENOUS
  Filled 2014-10-21: qty 5

## 2014-10-21 MED ORDER — SODIUM CHLORIDE 0.9 % IV SOLN
590.0000 mg | Freq: Once | INTRAVENOUS | Status: AC
  Administered 2014-10-21: 590 mg via INTRAVENOUS
  Filled 2014-10-21: qty 59

## 2014-10-21 MED ORDER — SODIUM CHLORIDE 0.9 % IV SOLN
400.0000 mg/m2 | Freq: Once | INTRAVENOUS | Status: AC
  Administered 2014-10-21: 750 mg via INTRAVENOUS
  Filled 2014-10-21: qty 7.5

## 2014-10-21 MED ORDER — SODIUM CHLORIDE 0.9 % IV SOLN
80.0000 mg/m2 | Freq: Once | INTRAVENOUS | Status: AC
  Administered 2014-10-21: 150 mg via INTRAVENOUS
  Filled 2014-10-21: qty 7.5

## 2014-10-22 ENCOUNTER — Inpatient Hospital Stay: Payer: BC Managed Care – PPO

## 2014-10-22 ENCOUNTER — Telehealth: Payer: Self-pay | Admitting: *Deleted

## 2014-10-22 DIAGNOSIS — C8330 Diffuse large B-cell lymphoma, unspecified site: Secondary | ICD-10-CM

## 2014-10-22 DIAGNOSIS — C8333 Diffuse large B-cell lymphoma, intra-abdominal lymph nodes: Secondary | ICD-10-CM | POA: Diagnosis not present

## 2014-10-22 MED ORDER — SODIUM CHLORIDE 0.9 % IV SOLN
Freq: Once | INTRAVENOUS | Status: AC
  Administered 2014-10-22: 15:00:00 via INTRAVENOUS
  Filled 2014-10-22: qty 4

## 2014-10-22 MED ORDER — SODIUM CHLORIDE 0.9 % IV SOLN
Freq: Once | INTRAVENOUS | Status: AC
  Administered 2014-10-22: 16:00:00 via INTRAVENOUS
  Filled 2014-10-22: qty 250

## 2014-10-22 MED ORDER — SODIUM CHLORIDE 0.9 % IJ SOLN
10.0000 mL | INTRAMUSCULAR | Status: AC | PRN
Start: 2014-10-22 — End: ?
  Administered 2014-10-22: 10 mL
  Filled 2014-10-22: qty 10

## 2014-10-22 MED ORDER — PEGFILGRASTIM 6 MG/0.6ML ~~LOC~~ PSKT
6.0000 mg | PREFILLED_SYRINGE | Freq: Once | SUBCUTANEOUS | Status: AC
  Administered 2014-10-22: 6 mg via SUBCUTANEOUS
  Filled 2014-10-22: qty 0.6

## 2014-10-22 MED ORDER — HEPARIN SOD (PORK) LOCK FLUSH 100 UNIT/ML IV SOLN
500.0000 [IU] | Freq: Once | INTRAVENOUS | Status: AC
Start: 2014-10-22 — End: 2014-10-22
  Administered 2014-10-22: 500 [IU] via INTRAVENOUS
  Filled 2014-10-22: qty 5

## 2014-10-22 MED ORDER — LORAZEPAM 0.5 MG PO TABS: 0.5000 mg | ORAL_TABLET | Freq: Four times a day (QID) | ORAL | Status: DC | PRN

## 2014-10-22 MED ORDER — ETOPOSIDE CHEMO INJECTION 1 GM/50ML
80.0000 mg/m2 | Freq: Once | INTRAVENOUS | Status: AC
  Administered 2014-10-22: 150 mg via INTRAVENOUS
  Filled 2014-10-22: qty 7.5

## 2014-10-22 MED ORDER — ONDANSETRON HCL 8 MG PO TABS: 8.0000 mg | ORAL_TABLET | Freq: Two times a day (BID) | ORAL | Status: DC | PRN

## 2014-10-22 NOTE — Addendum Note (Signed)
Addended by: Telford Nab on: 10/22/2014 05:15 PM   Modules accepted: Orders

## 2014-10-22 NOTE — Telephone Encounter (Signed)
Rx called in to pharmacy. 

## 2014-10-28 ENCOUNTER — Telehealth: Payer: Self-pay | Admitting: *Deleted

## 2014-10-28 ENCOUNTER — Inpatient Hospital Stay: Payer: BC Managed Care – PPO | Attending: Oncology

## 2014-10-28 ENCOUNTER — Encounter: Payer: Self-pay | Admitting: Oncology

## 2014-10-28 DIAGNOSIS — R197 Diarrhea, unspecified: Secondary | ICD-10-CM | POA: Diagnosis not present

## 2014-10-28 DIAGNOSIS — K219 Gastro-esophageal reflux disease without esophagitis: Secondary | ICD-10-CM | POA: Diagnosis not present

## 2014-10-28 DIAGNOSIS — C8590 Non-Hodgkin lymphoma, unspecified, unspecified site: Secondary | ICD-10-CM

## 2014-10-28 DIAGNOSIS — R112 Nausea with vomiting, unspecified: Secondary | ICD-10-CM | POA: Diagnosis not present

## 2014-10-28 DIAGNOSIS — Z79899 Other long term (current) drug therapy: Secondary | ICD-10-CM | POA: Insufficient documentation

## 2014-10-28 DIAGNOSIS — C8333 Diffuse large B-cell lymphoma, intra-abdominal lymph nodes: Secondary | ICD-10-CM | POA: Diagnosis present

## 2014-10-28 DIAGNOSIS — Z418 Encounter for other procedures for purposes other than remedying health state: Secondary | ICD-10-CM | POA: Insufficient documentation

## 2014-10-28 DIAGNOSIS — R109 Unspecified abdominal pain: Secondary | ICD-10-CM | POA: Diagnosis not present

## 2014-10-28 DIAGNOSIS — K760 Fatty (change of) liver, not elsewhere classified: Secondary | ICD-10-CM | POA: Insufficient documentation

## 2014-10-28 DIAGNOSIS — L658 Other specified nonscarring hair loss: Secondary | ICD-10-CM | POA: Insufficient documentation

## 2014-10-28 DIAGNOSIS — Z5111 Encounter for antineoplastic chemotherapy: Secondary | ICD-10-CM | POA: Diagnosis not present

## 2014-10-28 DIAGNOSIS — C8330 Diffuse large B-cell lymphoma, unspecified site: Secondary | ICD-10-CM

## 2014-10-28 LAB — CBC WITH DIFFERENTIAL/PLATELET
Basophils Absolute: 0 10*3/uL (ref 0–0.1)
Basophils Relative: 0 %
EOS ABS: 0 10*3/uL (ref 0–0.7)
Eosinophils Relative: 0 %
HCT: 32.4 % — ABNORMAL LOW (ref 35.0–47.0)
Hemoglobin: 10.7 g/dL — ABNORMAL LOW (ref 12.0–16.0)
Lymphocytes Relative: 100 %
Lymphs Abs: 0.5 10*3/uL — ABNORMAL LOW (ref 1.0–3.6)
MCH: 29.2 pg (ref 26.0–34.0)
MCHC: 33 g/dL (ref 32.0–36.0)
MCV: 88.7 fL (ref 80.0–100.0)
Monocytes Absolute: 0 10*3/uL — ABNORMAL LOW (ref 0.2–0.9)
Monocytes Relative: 0 %
Neutro Abs: 0 10*3/uL — ABNORMAL LOW (ref 1.4–6.5)
PLATELETS: 99 10*3/uL — AB (ref 150–440)
RBC: 3.65 MIL/uL — AB (ref 3.80–5.20)
RDW: 14.3 % (ref 11.5–14.5)
WBC: 0.5 10*3/uL — AB (ref 3.6–11.0)

## 2014-10-28 LAB — COMPREHENSIVE METABOLIC PANEL
ALT: 14 U/L (ref 14–54)
ANION GAP: 5 (ref 5–15)
AST: 14 U/L — ABNORMAL LOW (ref 15–41)
Albumin: 3.8 g/dL (ref 3.5–5.0)
Alkaline Phosphatase: 94 U/L (ref 38–126)
BUN: 16 mg/dL (ref 6–20)
CHLORIDE: 103 mmol/L (ref 101–111)
CO2: 27 mmol/L (ref 22–32)
CREATININE: 0.58 mg/dL (ref 0.44–1.00)
Calcium: 8.2 mg/dL — ABNORMAL LOW (ref 8.9–10.3)
GLUCOSE: 106 mg/dL — AB (ref 65–99)
POTASSIUM: 3.9 mmol/L (ref 3.5–5.1)
SODIUM: 135 mmol/L (ref 135–145)
Total Bilirubin: 0.7 mg/dL (ref 0.3–1.2)
Total Protein: 6 g/dL — ABNORMAL LOW (ref 6.5–8.1)

## 2014-10-28 MED ORDER — LEVOFLOXACIN 500 MG PO TABS: 500.0000 mg | ORAL_TABLET | Freq: Every day | ORAL | Status: DC

## 2014-10-28 NOTE — Telephone Encounter (Signed)
Called patient to inform her that she has decreased WBC and MD is calling Levaquin 500 mg daily for 5 days to pharmacy.  Patient instructed to call if she develops fever.  Verbalized understanding.

## 2014-10-29 LAB — METHOTREXATE

## 2014-11-03 ENCOUNTER — Inpatient Hospital Stay: Payer: BC Managed Care – PPO

## 2014-11-03 DIAGNOSIS — C8330 Diffuse large B-cell lymphoma, unspecified site: Secondary | ICD-10-CM

## 2014-11-03 DIAGNOSIS — C8333 Diffuse large B-cell lymphoma, intra-abdominal lymph nodes: Secondary | ICD-10-CM | POA: Diagnosis not present

## 2014-11-03 LAB — CBC WITH DIFFERENTIAL/PLATELET
Basophils Absolute: 0.1 10*3/uL (ref 0–0.1)
Basophils Relative: 0 %
EOS PCT: 0 %
Eosinophils Absolute: 0 10*3/uL (ref 0–0.7)
HEMATOCRIT: 30.5 % — AB (ref 35.0–47.0)
Hemoglobin: 10.1 g/dL — ABNORMAL LOW (ref 12.0–16.0)
Lymphocytes Relative: 4 %
Lymphs Abs: 0.7 10*3/uL — ABNORMAL LOW (ref 1.0–3.6)
MCH: 29.1 pg (ref 26.0–34.0)
MCHC: 33.1 g/dL (ref 32.0–36.0)
MCV: 87.9 fL (ref 80.0–100.0)
MONOS PCT: 7 %
Monocytes Absolute: 1 10*3/uL — ABNORMAL HIGH (ref 0.2–0.9)
NEUTROS PCT: 89 %
Neutro Abs: 13 10*3/uL — ABNORMAL HIGH (ref 1.4–6.5)
Platelets: 48 10*3/uL — ABNORMAL LOW (ref 150–440)
RBC: 3.47 MIL/uL — AB (ref 3.80–5.20)
RDW: 14.6 % — AB (ref 11.5–14.5)
WBC: 14.7 10*3/uL — ABNORMAL HIGH (ref 3.6–11.0)

## 2014-11-07 ENCOUNTER — Other Ambulatory Visit: Payer: Self-pay | Admitting: *Deleted

## 2014-11-07 DIAGNOSIS — C8330 Diffuse large B-cell lymphoma, unspecified site: Secondary | ICD-10-CM

## 2014-11-10 ENCOUNTER — Inpatient Hospital Stay: Payer: BC Managed Care – PPO

## 2014-11-10 ENCOUNTER — Inpatient Hospital Stay (HOSPITAL_BASED_OUTPATIENT_CLINIC_OR_DEPARTMENT_OTHER): Payer: BC Managed Care – PPO | Admitting: Oncology

## 2014-11-10 VITALS — BP 130/84 | HR 87 | Temp 97.1°F | Wt 162.3 lb

## 2014-11-10 DIAGNOSIS — K219 Gastro-esophageal reflux disease without esophagitis: Secondary | ICD-10-CM

## 2014-11-10 DIAGNOSIS — R112 Nausea with vomiting, unspecified: Secondary | ICD-10-CM | POA: Diagnosis not present

## 2014-11-10 DIAGNOSIS — K760 Fatty (change of) liver, not elsewhere classified: Secondary | ICD-10-CM

## 2014-11-10 DIAGNOSIS — R109 Unspecified abdominal pain: Secondary | ICD-10-CM

## 2014-11-10 DIAGNOSIS — C8330 Diffuse large B-cell lymphoma, unspecified site: Secondary | ICD-10-CM

## 2014-11-10 DIAGNOSIS — C8333 Diffuse large B-cell lymphoma, intra-abdominal lymph nodes: Secondary | ICD-10-CM

## 2014-11-10 DIAGNOSIS — R197 Diarrhea, unspecified: Secondary | ICD-10-CM | POA: Diagnosis not present

## 2014-11-10 DIAGNOSIS — Z79899 Other long term (current) drug therapy: Secondary | ICD-10-CM

## 2014-11-10 DIAGNOSIS — L658 Other specified nonscarring hair loss: Secondary | ICD-10-CM

## 2014-11-10 LAB — CBC WITH DIFFERENTIAL/PLATELET
BASOS ABS: 0 10*3/uL (ref 0–0.1)
Basophils Relative: 0 %
EOS PCT: 0 %
Eosinophils Absolute: 0 10*3/uL (ref 0–0.7)
HCT: 32.7 % — ABNORMAL LOW (ref 35.0–47.0)
HEMOGLOBIN: 10.9 g/dL — AB (ref 12.0–16.0)
Lymphocytes Relative: 7 %
Lymphs Abs: 0.7 10*3/uL — ABNORMAL LOW (ref 1.0–3.6)
MCH: 29.5 pg (ref 26.0–34.0)
MCHC: 33.4 g/dL (ref 32.0–36.0)
MCV: 88.2 fL (ref 80.0–100.0)
MONOS PCT: 9 %
Monocytes Absolute: 0.9 10*3/uL (ref 0.2–0.9)
NEUTROS ABS: 8.5 10*3/uL — AB (ref 1.4–6.5)
NEUTROS PCT: 84 %
PLATELETS: 399 10*3/uL (ref 150–440)
RBC: 3.71 MIL/uL — ABNORMAL LOW (ref 3.80–5.20)
RDW: 15.2 % — ABNORMAL HIGH (ref 11.5–14.5)
WBC: 10.3 10*3/uL (ref 3.6–11.0)

## 2014-11-10 LAB — COMPREHENSIVE METABOLIC PANEL
ALT: 20 U/L (ref 14–54)
AST: 26 U/L (ref 15–41)
Albumin: 4.1 g/dL (ref 3.5–5.0)
Alkaline Phosphatase: 94 U/L (ref 38–126)
Anion gap: 7 (ref 5–15)
BILIRUBIN TOTAL: 0.8 mg/dL (ref 0.3–1.2)
BUN: 15 mg/dL (ref 6–20)
CALCIUM: 8.9 mg/dL (ref 8.9–10.3)
CHLORIDE: 104 mmol/L (ref 101–111)
CO2: 26 mmol/L (ref 22–32)
Creatinine, Ser: 0.7 mg/dL (ref 0.44–1.00)
GFR calc Af Amer: >60 mL/min (ref >60)
Glucose, Bld: 114 mg/dL — ABNORMAL HIGH (ref 65–99)
Potassium: 3.9 mmol/L (ref 3.5–5.1)
Sodium: 137 mmol/L (ref 135–145)
Total Protein: 6.7 g/dL (ref 6.5–8.1)

## 2014-11-10 LAB — MAGNESIUM: Magnesium: 2 mg/dL (ref 1.7–2.4)

## 2014-11-10 MED ORDER — SODIUM CHLORIDE 0.9 % IJ SOLN
10.0000 mL | INTRAMUSCULAR | Status: DC | PRN
  Administered 2014-11-10: 10 mL
  Filled 2014-11-10: qty 10

## 2014-11-10 MED ORDER — ACETAMINOPHEN 325 MG PO TABS
650.0000 mg | ORAL_TABLET | Freq: Once | ORAL | Status: AC
  Administered 2014-11-10: 650 mg via ORAL
  Filled 2014-11-10: qty 2

## 2014-11-10 MED ORDER — SODIUM CHLORIDE 0.9 % IV SOLN: 375.0000 mg/m2 | Freq: Once | INTRAVENOUS | Status: DC

## 2014-11-10 MED ORDER — HEPARIN SOD (PORK) LOCK FLUSH 100 UNIT/ML IV SOLN
500.0000 [IU] | Freq: Once | INTRAVENOUS | Status: AC | PRN
  Administered 2014-11-10: 500 [IU]
  Filled 2014-11-10: qty 5

## 2014-11-10 MED ORDER — SODIUM CHLORIDE 0.9 % IV SOLN
90.0000 mg/m2 | Freq: Once | INTRAVENOUS | Status: AC
  Administered 2014-11-10: 170 mg via INTRAVENOUS
  Filled 2014-11-10: qty 8.5

## 2014-11-10 MED ORDER — SODIUM CHLORIDE 0.9 % IV SOLN
Freq: Once | INTRAVENOUS | Status: AC
  Administered 2014-11-10: 10:00:00 via INTRAVENOUS
  Filled 2014-11-10: qty 4

## 2014-11-10 MED ORDER — SODIUM CHLORIDE 0.9 % IV SOLN
375.0000 mg/m2 | Freq: Once | INTRAVENOUS | Status: AC
  Administered 2014-11-10: 700 mg via INTRAVENOUS
  Filled 2014-11-10: qty 60

## 2014-11-10 MED ORDER — SODIUM CHLORIDE 0.9 % IV SOLN
INTRAVENOUS | Status: DC
  Administered 2014-11-10: 10:00:00 via INTRAVENOUS
  Filled 2014-11-10: qty 1000

## 2014-11-10 MED ORDER — DIPHENHYDRAMINE HCL 25 MG PO CAPS
50.0000 mg | ORAL_CAPSULE | Freq: Once | ORAL | Status: AC
  Administered 2014-11-10: 50 mg via ORAL
  Filled 2014-11-10: qty 2

## 2014-11-10 NOTE — Progress Notes (Signed)
Patient does not have living will.  Never smoked.  Pt complained of n/v once this morning. Pt further states she had diarrhea this morning.

## 2014-11-11 ENCOUNTER — Telehealth: Payer: Self-pay | Admitting: *Deleted

## 2014-11-11 ENCOUNTER — Encounter: Payer: Self-pay | Admitting: Oncology

## 2014-11-11 ENCOUNTER — Inpatient Hospital Stay: Payer: BC Managed Care – PPO

## 2014-11-11 VITALS — BP 128/86 | HR 86 | Temp 97.0°F | Resp 18

## 2014-11-11 DIAGNOSIS — C8330 Diffuse large B-cell lymphoma, unspecified site: Secondary | ICD-10-CM

## 2014-11-11 DIAGNOSIS — C8333 Diffuse large B-cell lymphoma, intra-abdominal lymph nodes: Secondary | ICD-10-CM | POA: Diagnosis not present

## 2014-11-11 MED ORDER — SODIUM CHLORIDE 0.9 % IV SOLN
Freq: Once | INTRAVENOUS | Status: AC
  Administered 2014-11-11: 10:00:00 via INTRAVENOUS
  Filled 2014-11-11: qty 5

## 2014-11-11 MED ORDER — SODIUM CHLORIDE 0.9 % IV SOLN
Freq: Once | INTRAVENOUS | Status: AC
  Administered 2014-11-11: 14:00:00 via INTRAVENOUS
  Filled 2014-11-11: qty 148

## 2014-11-11 MED ORDER — ETOPOSIDE CHEMO INJECTION 1 GM/50ML
90.0000 mg/m2 | Freq: Once | INTRAVENOUS | Status: AC
  Administered 2014-11-11: 170 mg via INTRAVENOUS
  Filled 2014-11-11: qty 8.5

## 2014-11-11 MED ORDER — SODIUM CHLORIDE 0.9 % IV SOLN
590.0000 mg | Freq: Once | INTRAVENOUS | Status: AC
  Administered 2014-11-11: 590 mg via INTRAVENOUS
  Filled 2014-11-11: qty 59

## 2014-11-11 MED ORDER — PALONOSETRON HCL INJECTION 0.25 MG/5ML
0.2500 mg | Freq: Once | INTRAVENOUS | Status: AC
  Administered 2014-11-11: 0.25 mg via INTRAVENOUS
  Filled 2014-11-11: qty 5

## 2014-11-11 MED ORDER — SODIUM CHLORIDE 0.9 % IV SOLN
INTRAVENOUS | Status: AC
  Administered 2014-11-11: 09:00:00 via INTRAVENOUS
  Filled 2014-11-11: qty 1000

## 2014-11-11 MED ORDER — MESNA 100 MG/ML IV SOLN
400.0000 mg/m2 | Freq: Once | INTRAVENOUS | Status: AC
  Administered 2014-11-11: 750 mg via INTRAVENOUS
  Filled 2014-11-11: qty 7.5

## 2014-11-11 NOTE — Telephone Encounter (Signed)
Per Fraser Din at [facility] pt will have full workup including PET scan and repeat bone marrow at [facility]. Fraser Din will reach out to patient to set up appts at the beginning of August. Records faxed to [facility] at [phone].

## 2014-11-11 NOTE — Progress Notes (Signed)
[place] @ [facility] [phone]) [phone]  Fax:(336) [phone]     Karagan Lehr OB: June 17, 1955  MR#: [id number]  EFE#:[id number]  Patient Care Team: Nicoletta Dress, MD as PCP - General (Internal Medicine) Christene Lye, MD (General Surgery) Margarita Rana, MD as Referring Physician (Family Medicine)  CHIEF COMPLAINT:  Chief Complaint  Patient presents with  . Follow-up    Oncology History   1.abnormal PET scan and CT scan Abdominal mass biopsy on 11th of January is consistent with diffuse large cell lymphoma B-cell type Fish panel was negative for any double hit  lymphoma 2.LDH is 276 3.Started on chemotherapy with R CHOP allopurinol was started few days before (May 14, 2014) patient received high-dose methotrexate after cycle 2 4, PET scan has been reviewed (April,18 th 2016) reported to be having excellent respons 5.  Patient has finished total 6 cycles of chemotherapy with R CHOP and high-dose methotrexate on the June of 2016 PET scan shows good response however still persistent disease.  (October 14, 2014) 6.  Repeat CT scan revealed progressing disease patient has been evaluated in the bone marrow transplant team and suggested to reinitiate treatment with a rise chemotherapy followed by repeat PET scan and consideration of high-dose chemotherapy with stem cell support     Diffuse large B cell lymphoma   09/05/2014 Initial Diagnosis Diffuse large B cell lymphoma    B-cell lymphoma   05/05/2014 Initial Diagnosis B-cell lymphoma    Oncology Flowsheet 09/26/2014 09/27/2014 10/20/2014 10/21/2014 10/21/2014 10/22/2014 11/10/2014  Day, Cycle - - Day 1, Cycle 1 Day 2, Cycle 1   Day 3, Cycle 1 Day 1, Cycle 2  CARBOplatin (PARAPLATIN) IV - - - 590 mg   - -  cyclophosphamide (CYTOXAN) IV - - - - - - -  dexamethasone (DECADRON) IV - - [ 20 mg ] [ 12 mg ]   [ 4 mg ] [ 10 mg ]  diphenhydrAMINE (BENADRYL) PO - - 50 mg - - - 50 mg  DOXOrubicin (ADRIAMYCIN) IV - - - - - - -    etoposide (VEPESID) IV - - 80 mg/m2 80 mg/m2   80 mg/m2 90 mg/m2  fosaprepitant (EMEND) IV - - - [ 150 mg ]   - -  ifosfamide (IFEX) IV - - - [ 4,000 mg/m2 ]   - -  leucovorin (WELLCOVORIN) IV [ 26 mg ] [ 26 mg ] - - - - -  mesna (MESNEX) IV - - - 400 mg/m2 [ 4,000 mg/m2 ] - -  methotrexate (PF) IV - - - - - - -  ondansetron (ZOFRAN) IV - - [ 16 mg ] - - [ 8 mg ] [ 8 mg ]  palonosetron (ALOXI) IV - - - 0.25 mg   - -  pegfilgrastim (NEULASTA ONPRO KIT) [place] - - - - - 6 mg -  riTUXimab (RITUXAN) IV - - 375 mg/m2 - - - 375 mg/m2  vinCRIStine (ONCOVIN) IV - - - - - - -    INTERVAL HISTORY: 59 year old lady with diffuse large B-cell lymphoma here to initiate 6 cycle of chemotherapy.  Patient has tolerated treatment very well.  Has occasional abdominal discomfort.  No nausea no vomiting.  No soreness in the mouth.  No tingling numbness.  Patient is also due for high-dose methotrexate third and the last treatment. October 14, 2014 Patient continues to have some lower abdominal discomfort.  No nausea no vomiting.  No fever.  Appetite has been fairly  stable.  Patient had a repeat PET scan.  Here to review scan and further planning of treatment November 10, 2014 Patient is here for recurrent diffuse be last cell lymphoma and for second cycle of chemotherapy with [place] Patient had neutropenia.  No fever.  Was not hospitalized.  Had episode of diarrhea.  Abdominal discomfort.  Here for further follow-up.  No tingling.  No numbness.  Alopecia.  No stomatitis.  REVIEW OF SYSTEMS:   GENERAL:  Feels good.  Active.  No fevers, sweats or weight loss. PERFORMANCE STATUS (ECOG):  0 HEENT:  No visual changes, runny nose, sore throat, mouth sores or tenderness. Lungs: No shortness of breath or cough.  No hemoptysis. Cardiac:  No chest pain, palpitations, orthopnea, or PND. GI:  No nausea, vomiting, diarrhea, constipation, melena or hematochezia. GU:  No urgency, frequency, dysuria, or hematuria. Musculoskeletal:  No  back pain.  No joint pain.  No muscle tenderness. Extremities:  No pain or swelling. Skin:  No rashes or skin changes. Neuro:  No headache, numbness or weakness, balance or coordination issues. Endocrine:  No diabetes, thyroid issues, hot flashes or night sweats. Psych:  No mood changes, depression or anxiety. Pain:  No focal pain. Review of systems:  All other systems reviewed and found to be negative.  As per HPI. Otherwise, a complete review of systems is negatve.  PAST MEDICAL HISTORY: Past Medical History  Diagnosis Date  . GERD (gastroesophageal reflux disease)   . 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 Anemia   . Fatty liver unknown  . 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 Upper gi endoscopy  2004    Dr Vira Agar  . Colonoscopy  2012    Dr. Vira Agar    FAMILY HISTORY Family History  Problem Relation Age of Onset  . Cancer Father     bone/brain        ADVANCED DIRECTIVES: Does not have any advance care directive   HEALTH MAINTENANCE: History  Substance Use Topics  . Smoking status: Never Smoker   . Smokeless tobacco: Not on file  . Alcohol Use: No     Comment: 4/week     Allergies  Allergen Reactions  . Pollen Extract Itching    Current Outpatient Prescriptions  Medication Sig Dispense Refill  . acetaminophen (TYLENOL) 500 MG tablet Take 500 mg by mouth every 6 (six) hours as needed for mild pain.     . Cholecalciferol (VITAMIN D-3) 1000 UNITS CAPS Take 1 capsule by mouth daily.     . citalopram (CELEXA) 20 MG tablet Take 20 mg by mouth daily.    . diphenhydrAMINE (BENADRYL) 25 MG tablet Take 25 mg by mouth daily as needed for allergies.    . fluticasone (FLONASE) 50 MCG/ACT nasal spray   3  . fluticasone (VERAMYST) 27.5 MCG/SPRAY nasal spray Place 2 sprays into the nose 2 (two) times daily as needed for allergies.     Marland Kitchen HYDROcodone-acetaminophen (NORCO/VICODIN)  5-325 MG per tablet Take 1 tablet by mouth every 6 (six) hours as needed for moderate pain.    Marland Kitchen levofloxacin (LEVAQUIN) 500 MG tablet Take 1 tablet (500 mg total) by mouth daily. 5 tablet 0  . lidocaine-prilocaine (EMLA) cream Apply 1 application topically as needed. 30 min prior to accessing port 30 g 3  . LORazepam (ATIVAN) 0.5 MG tablet Take 1 tablet (0.5 mg total) by mouth every 6 (six)  hours as needed (Nausea or vomiting). 30 tablet 3  . omeprazole (PRILOSEC) 40 MG capsule Take 40 mg by mouth daily.    . ondansetron (ZOFRAN) 8 MG tablet Take 1 tablet (8 mg total) by mouth 2 (two) times daily. Start the day after chemo for 3 days. Then as needed for nausea or vomiting. 30 tablet 1  . ondansetron (ZOFRAN) 8 MG tablet Take 1 tablet (8 mg total) by mouth 2 (two) times daily as needed (Nausea or vomiting). 30 tablet 1  . polyethylene glycol (MIRALAX / GLYCOLAX) packet Take 17 g by mouth daily as needed for mild constipation.     . Probiotic Product (PROBIOTIC DAILY PO) Take 1 capsule by mouth daily.    . promethazine (PHENERGAN) 12.5 MG tablet Take 12.5 mg by mouth every 6 (six) hours as needed for nausea or vomiting.    . Turmeric (RA TURMERIC) 500 MG CAPS Take by mouth 2 (two) times daily.     No current facility-administered medications for this visit.   Facility-Administered Medications Ordered in Other Visits  Medication Dose Route Frequency Provider Last Rate Last Dose  . CARBOplatin (PARAPLATIN) 590 mg in sodium chloride 0.9 % 250 mL chemo infusion  590 mg Intravenous Once Forest Gleason, MD      . etoposide (VEPESID) 170 mg in sodium chloride 0.9 % 500 mL chemo infusion  90 mg/m2 (Treatment Plan Actual) Intravenous Once Forest Gleason, MD      . fosaprepitant (EMEND) 150 mg, dexamethasone (DECADRON) 12 mg in sodium chloride 0.9 % 145 mL IVPB   Intravenous Once Forest Gleason, MD      . ifosfamide (IFEX) 7,400 mg, mesna (MESNEX) 7,400 mg in sodium chloride 0.9 % 150 mL chemo infusion    Intravenous Once Forest Gleason, MD      . mesna (MESNEX) 750 mg in sodium chloride 0.9 % 50 mL infusion  400 mg/m2 (Treatment Plan Actual) Intravenous Once Forest Gleason, MD      . palonosetron (ALOXI) injection 0.25 mg  0.25 mg Intravenous Once Forest Gleason, MD      . sodium chloride 0.9 % injection 10 mL  10 mL Intracatheter PRN Forest Gleason, MD   10 mL at 10/22/14 1426    OBJECTIVE:  Filed Vitals:   11/10/14 0907  BP: 130/84  Pulse: 87  Temp: 97.1 F (36.2 C)     Body mass index is 27 kg/(m^2).    ECOG FS:0 - Asymptomatic  PHYSICAL EXAM: General  status: Performance status is good.  Patient has not lost significant weight HEENT: No evidence of stomatitis.  And alopecia Sclera and conjunctivae :: No jaundice.   pale looking . Lungs: Air  entry equal on both sides.  No rhonchi.  No rales.  Cardiac: Heart sounds are normal.  No pericardial rub.  No murmur. Lymphatic system: Cervical, axillary, inguinal, lymph nodes not palpable GI: Abdomen is soft.  No ascites.  Liver spleen not palpable.  No tenderness.  Bowel sounds are within normal limit Lower extremity: No edema Neurological system: Higher functions, cranial nerves intact No evidence of peripheral neuropathy. Skin: No rash.  No ecchymosis.Marland Kitchen     LAB RESULTS:  Infusion on 11/10/2014  Component Date Value Ref Range Status  . WBC 11/10/2014 10.3  3.6 - 11.0 K/uL Final   A-LINE DRAW  . RBC 11/10/2014 3.71* 3.80 - 5.20 MIL/uL Final  . Hemoglobin 11/10/2014 10.9* 12.0 - 16.0 g/dL Final  . HCT 11/10/2014 32.7* 35.0 - 47.0 % Final  .  MCV 11/10/2014 88.2  80.0 - 100.0 fL Final  . MCH 11/10/2014 29.5  26.0 - 34.0 pg Final  . MCHC 11/10/2014 33.4  32.0 - 36.0 g/dL Final  . RDW 11/10/2014 15.2* 11.5 - 14.5 % Final  . Platelets 11/10/2014 399  150 - 440 K/uL Final  . Neutrophils Relative % 11/10/2014 84   Final  . Neutro Abs 11/10/2014 8.5* 1.4 - 6.5 K/uL Final  . Lymphocytes Relative 11/10/2014 7   Final  . Lymphs Abs  11/10/2014 0.7* 1.0 - 3.6 K/uL Final  . Monocytes Relative 11/10/2014 9   Final  . Monocytes Absolute 11/10/2014 0.9  0.2 - 0.9 K/uL Final  . Eosinophils Relative 11/10/2014 0   Final  . Eosinophils Absolute 11/10/2014 0.0  0 - 0.7 K/uL Final  . Basophils Relative 11/10/2014 0   Final  . Basophils Absolute 11/10/2014 0.0  0 - 0.1 K/uL Final  . Sodium 11/10/2014 137  135 - 145 mmol/L Final  . Potassium 11/10/2014 3.9  3.5 - 5.1 mmol/L Final  . Chloride 11/10/2014 104  101 - 111 mmol/L Final  . CO2 11/10/2014 26  22 - 32 mmol/L Final  . Glucose, Bld 11/10/2014 114* 65 - 99 mg/dL Final  . BUN 11/10/2014 15  6 - 20 mg/dL Final  . Creatinine, Ser 11/10/2014 0.70  0.44 - 1.00 mg/dL Final  . Calcium 11/10/2014 8.9  8.9 - 10.3 mg/dL Final  . Total Protein 11/10/2014 6.7  6.5 - 8.1 g/dL Final  . Albumin 11/10/2014 4.1  3.5 - 5.0 g/dL Final  . AST 11/10/2014 26  15 - 41 U/L Final  . ALT 11/10/2014 20  14 - 54 U/L Final  . Alkaline Phosphatase 11/10/2014 94  38 - 126 U/L Final  . Total Bilirubin 11/10/2014 0.8  0.3 - 1.2 mg/dL Final  . GFR calc non Af Amer 11/10/2014 >60  >60 mL/min Final  . GFR calc Af Amer 11/10/2014 >60  >60 mL/min Final   Comment: (NOTE) The eGFR has been calculated using the CKD EPI equation. This calculation has not been validated in all clinical situations. eGFR's persistently <60 mL/min signify possible Chronic Kidney Disease.   . Anion gap 11/10/2014 7  5 - 15 Final  . Magnesium 11/10/2014 2.0  1.7 - 2.4 mg/dL Final      STUDIES: No results found.  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 Pre-transplant chemotherapy MEDICAL DECISION MAKING:  Proceed with second cycle of chemotherapy. Discussed situation with bone marrow transplant team at [facility]. They will arrange for complete restaging PET scan and further workup that in the first week of August.  If needed third cycle of chemotherapy would be planned   Diffuse  large B cell lymphoma   Staging form: Lymphoid Neoplasms, AJCC 6th Edition     Clinical: Stage III - Marni Griffon, MD   11/11/2014 9:11 AM

## 2014-11-12 ENCOUNTER — Inpatient Hospital Stay: Payer: BC Managed Care – PPO

## 2014-11-12 VITALS — BP 115/71 | HR 81 | Temp 98.6°F

## 2014-11-12 DIAGNOSIS — C8330 Diffuse large B-cell lymphoma, unspecified site: Secondary | ICD-10-CM

## 2014-11-12 DIAGNOSIS — C8333 Diffuse large B-cell lymphoma, intra-abdominal lymph nodes: Secondary | ICD-10-CM | POA: Diagnosis not present

## 2014-11-12 MED ORDER — SODIUM CHLORIDE 0.9 % IV SOLN
90.0000 mg/m2 | Freq: Once | INTRAVENOUS | Status: AC
  Administered 2014-11-12: 170 mg via INTRAVENOUS
  Filled 2014-11-12: qty 8.5

## 2014-11-12 MED ORDER — HEPARIN SOD (PORK) LOCK FLUSH 100 UNIT/ML IV SOLN
500.0000 [IU] | Freq: Once | INTRAVENOUS | Status: AC
  Administered 2014-11-12: 500 [IU] via INTRAVENOUS

## 2014-11-12 MED ORDER — SODIUM CHLORIDE 0.9 % IV SOLN
Freq: Once | INTRAVENOUS | Status: AC
  Administered 2014-11-12: 15:00:00 via INTRAVENOUS
  Filled 2014-11-12: qty 4

## 2014-11-12 MED ORDER — PEGFILGRASTIM 6 MG/0.6ML ~~LOC~~ PSKT: 6.0000 mg | PREFILLED_SYRINGE | Freq: Once | SUBCUTANEOUS | Status: DC

## 2014-11-13 ENCOUNTER — Other Ambulatory Visit: Payer: Self-pay | Admitting: *Deleted

## 2014-11-13 DIAGNOSIS — C8590 Non-Hodgkin lymphoma, unspecified, unspecified site: Secondary | ICD-10-CM

## 2014-11-14 ENCOUNTER — Inpatient Hospital Stay: Payer: BC Managed Care – PPO

## 2014-11-14 DIAGNOSIS — C8590 Non-Hodgkin lymphoma, unspecified, unspecified site: Secondary | ICD-10-CM

## 2014-11-14 DIAGNOSIS — C8333 Diffuse large B-cell lymphoma, intra-abdominal lymph nodes: Secondary | ICD-10-CM | POA: Diagnosis not present

## 2014-11-14 MED ORDER — PEGFILGRASTIM INJECTION 6 MG/0.6ML ~~LOC~~
6.0000 mg | PREFILLED_SYRINGE | Freq: Once | SUBCUTANEOUS | Status: AC
  Administered 2014-11-14: 6 mg via SUBCUTANEOUS
  Filled 2014-11-14: qty 0.6

## 2014-11-17 ENCOUNTER — Inpatient Hospital Stay: Payer: BC Managed Care – PPO

## 2014-11-17 DIAGNOSIS — C8333 Diffuse large B-cell lymphoma, intra-abdominal lymph nodes: Secondary | ICD-10-CM | POA: Diagnosis not present

## 2014-11-17 DIAGNOSIS — C8330 Diffuse large B-cell lymphoma, unspecified site: Secondary | ICD-10-CM

## 2014-11-17 LAB — CBC WITH DIFFERENTIAL/PLATELET
BASOS PCT: 0 %
Basophils Absolute: 0 10*3/uL (ref 0–0.1)
Eosinophils Absolute: 0 10*3/uL (ref 0–0.7)
Eosinophils Relative: 0 %
HEMATOCRIT: 28.5 % — AB (ref 35.0–47.0)
Hemoglobin: 9.5 g/dL — ABNORMAL LOW (ref 12.0–16.0)
LYMPHS PCT: 4 %
Lymphs Abs: 0.3 10*3/uL — ABNORMAL LOW (ref 1.0–3.6)
MCH: 29.7 pg (ref 26.0–34.0)
MCHC: 33.4 g/dL (ref 32.0–36.0)
MCV: 88.9 fL (ref 80.0–100.0)
Monocytes Absolute: 0.2 10*3/uL (ref 0.2–0.9)
Monocytes Relative: 2 %
NEUTROS ABS: 7.5 10*3/uL — AB (ref 1.4–6.5)
Neutrophils Relative %: 94 %
PLATELETS: 252 10*3/uL (ref 150–440)
RBC: 3.21 MIL/uL — AB (ref 3.80–5.20)
RDW: 15.5 % — AB (ref 11.5–14.5)
WBC: 8 10*3/uL (ref 3.6–11.0)

## 2014-11-24 ENCOUNTER — Inpatient Hospital Stay: Payer: BC Managed Care – PPO | Attending: Oncology

## 2014-11-24 DIAGNOSIS — C8333 Diffuse large B-cell lymphoma, intra-abdominal lymph nodes: Secondary | ICD-10-CM | POA: Insufficient documentation

## 2014-11-24 DIAGNOSIS — C8330 Diffuse large B-cell lymphoma, unspecified site: Secondary | ICD-10-CM

## 2014-11-24 LAB — CBC WITH DIFFERENTIAL/PLATELET
BASOS ABS: 0 10*3/uL (ref 0–0.1)
Basophils Relative: 0 %
EOS PCT: 0 %
Eosinophils Absolute: 0 10*3/uL (ref 0–0.7)
HCT: 30 % — ABNORMAL LOW (ref 35.0–47.0)
HEMOGLOBIN: 9.9 g/dL — AB (ref 12.0–16.0)
LYMPHS PCT: 7 %
Lymphs Abs: 0.7 10*3/uL — ABNORMAL LOW (ref 1.0–3.6)
MCH: 29.7 pg (ref 26.0–34.0)
MCHC: 33 g/dL (ref 32.0–36.0)
MCV: 89.9 fL (ref 80.0–100.0)
MONOS PCT: 8 %
Monocytes Absolute: 0.8 10*3/uL (ref 0.2–0.9)
NEUTROS ABS: 7.7 10*3/uL — AB (ref 1.4–6.5)
Neutrophils Relative %: 85 %
PLATELETS: 43 10*3/uL — AB (ref 150–440)
RBC: 3.34 MIL/uL — ABNORMAL LOW (ref 3.80–5.20)
RDW: 15.7 % — ABNORMAL HIGH (ref 11.5–14.5)
WBC: 9.2 10*3/uL (ref 3.6–11.0)

## 2014-12-22 ENCOUNTER — Other Ambulatory Visit: Payer: Self-pay | Admitting: Family Medicine

## 2015-02-16 ENCOUNTER — Encounter: Payer: Self-pay | Admitting: Oncology

## 2015-02-16 ENCOUNTER — Inpatient Hospital Stay: Payer: BC Managed Care – PPO

## 2015-02-16 ENCOUNTER — Inpatient Hospital Stay: Payer: BC Managed Care – PPO | Attending: Oncology | Admitting: Oncology

## 2015-02-16 VITALS — BP 120/83 | HR 96 | Temp 96.4°F | Wt 159.0 lb

## 2015-02-16 DIAGNOSIS — Z79899 Other long term (current) drug therapy: Secondary | ICD-10-CM | POA: Diagnosis not present

## 2015-02-16 DIAGNOSIS — Z9221 Personal history of antineoplastic chemotherapy: Secondary | ICD-10-CM

## 2015-02-16 DIAGNOSIS — C8333 Diffuse large B-cell lymphoma, intra-abdominal lymph nodes: Secondary | ICD-10-CM

## 2015-02-16 DIAGNOSIS — C8330 Diffuse large B-cell lymphoma, unspecified site: Secondary | ICD-10-CM

## 2015-02-16 DIAGNOSIS — K760 Fatty (change of) liver, not elsewhere classified: Secondary | ICD-10-CM

## 2015-02-16 DIAGNOSIS — K219 Gastro-esophageal reflux disease without esophagitis: Secondary | ICD-10-CM | POA: Diagnosis not present

## 2015-02-16 DIAGNOSIS — L658 Other specified nonscarring hair loss: Secondary | ICD-10-CM | POA: Diagnosis not present

## 2015-02-16 DIAGNOSIS — R197 Diarrhea, unspecified: Secondary | ICD-10-CM | POA: Diagnosis not present

## 2015-02-16 LAB — COMPREHENSIVE METABOLIC PANEL
ALBUMIN: 3.9 g/dL (ref 3.5–5.0)
ALK PHOS: 71 U/L (ref 38–126)
ALT: 21 U/L (ref 14–54)
ANION GAP: 6 (ref 5–15)
AST: 26 U/L (ref 15–41)
BUN: 13 mg/dL (ref 6–20)
CALCIUM: 9 mg/dL (ref 8.9–10.3)
CO2: 26 mmol/L (ref 22–32)
Chloride: 106 mmol/L (ref 101–111)
Creatinine, Ser: 0.59 mg/dL (ref 0.44–1.00)
GFR calc Af Amer: >60 mL/min (ref >60)
GFR calc non Af Amer: >60 mL/min (ref >60)
GLUCOSE: 92 mg/dL (ref 65–99)
Potassium: 4.1 mmol/L (ref 3.5–5.1)
SODIUM: 138 mmol/L (ref 135–145)
Total Bilirubin: 0.8 mg/dL (ref 0.3–1.2)
Total Protein: 6.2 g/dL — ABNORMAL LOW (ref 6.5–8.1)

## 2015-02-16 LAB — CBC WITH DIFFERENTIAL/PLATELET
BASOS ABS: 0.1 10*3/uL (ref 0–0.1)
BASOS PCT: 1 %
EOS ABS: 0.1 10*3/uL (ref 0–0.7)
Eosinophils Relative: 3 %
HCT: 35.1 % (ref 35.0–47.0)
HEMOGLOBIN: 11.9 g/dL — AB (ref 12.0–16.0)
Lymphocytes Relative: 18 %
Lymphs Abs: 0.7 10*3/uL — ABNORMAL LOW (ref 1.0–3.6)
MCH: 31 pg (ref 26.0–34.0)
MCHC: 33.8 g/dL (ref 32.0–36.0)
MCV: 91.8 fL (ref 80.0–100.0)
Monocytes Absolute: 0.7 10*3/uL (ref 0.2–0.9)
Monocytes Relative: 18 %
NEUTROS PCT: 60 %
Neutro Abs: 2.4 10*3/uL (ref 1.4–6.5)
Platelets: 151 10*3/uL (ref 150–440)
RBC: 3.82 MIL/uL (ref 3.80–5.20)
RDW: 19.2 % — ABNORMAL HIGH (ref 11.5–14.5)
WBC: 4 10*3/uL (ref 3.6–11.0)

## 2015-02-16 LAB — MAGNESIUM: Magnesium: 1.7 mg/dL (ref 1.7–2.4)

## 2015-02-16 NOTE — Progress Notes (Signed)
[place] @ [facility] Telephone:(336) [phone]  Fax:(336) [phone]     April Nolan OB: November 12, 1955  MR#: [id number]  YTK#:[id number]  Patient Care Team: Nicoletta Dress, MD as PCP - General (Internal Medicine) Christene Lye, MD (General Surgery) Margarita Rana, MD as Referring Physician (Family Medicine)  CHIEF COMPLAINT:  Chief Complaint  Patient presents with  . OTHER   Oncology History   1.abnormal PET scan and CT scan Abdominal mass biopsy on 11th of January is consistent with diffuse large cell lymphoma B-cell type Fish panel was negative for any double hit  lymphoma 2.LDH is 276 3.Started on chemotherapy with R CHOP allopurinol was started few days before (May 14, 2014) patient received high-dose methotrexate after cycle 2 4, PET scan has been reviewed (April,18 th 2016) reported to be having excellent respons 5.  Patient has finished total 6 cycles of chemotherapy with R CHOP and high-dose methotrexate on the June of 2016 PET scan shows good response however still persistent disease.  (October 14, 2014) 6.  Repeat CT scan revealed progressing disease patient has been evaluated in the bone marrow transplant team and suggested to reinitiate treatment with a rise chemotherapy followed by repeat PET scan and consideration of high-dose chemotherapy with stem cell support     7.  Patient has finished high-dose chemotherapy with stem cell support in October of 2016  Oncology Flowsheet 10/21/2014 10/22/2014 11/10/2014 11/11/2014 11/11/2014 11/12/2014 11/14/2014  Day, Cycle   Day 3, Cycle 1 Day 1, Cycle 2 Day 2, Cycle 2   Day 3, Cycle 2 -  CARBOplatin (PARAPLATIN) IV   - - 590 mg   - -  cyclophosphamide (CYTOXAN) IV - - - - - - -  dexamethasone (DECADRON) IV   [ 4 mg ] [ 10 mg ] [ 12 mg ]   [ 4 mg ] -  diphenhydrAMINE (BENADRYL) PO - - 50 mg - - - -  DOXOrubicin (ADRIAMYCIN) IV - - - - - - -  etoposide (VEPESID) IV   80 mg/m2 90 mg/m2 90 mg/m2   90 mg/m2 -  fosaprepitant  (EMEND) IV   - - [ 150 mg ]   - -  ifosfamide (IFEX) IV   - - [ 4,000 mg/m2 ]   - -  leucovorin (WELLCOVORIN) IV - - - - - - -  mesna (MESNEX) IV [ 4,000 mg/m2 ] - - 400 mg/m2 [ 7,400 mg ] - -  methotrexate (PF) IV - - - - - - -  ondansetron (ZOFRAN) IV - [ 8 mg ] [ 8 mg ] - - [ 8 mg ] -  palonosetron (ALOXI) IV   - - 0.25 mg   - -  pegfilgrastim (NEULASTA ONPRO KIT) [place] - 6 mg - - - - -  pegfilgrastim (NEULASTA) [place] - - - - - - 6 mg  riTUXimab (RITUXAN) IV - - 375 mg/m2 - - - -  vinCRIStine (ONCOVIN) IV - - - - - - -    INTERVAL HISTORY: 59 year old lady with a history of 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  Had a high-dose chemotherapy stem cells support. Patient continues to have loose stool. No abdominal pain no nausea no vomiting. The patient is supposed to get regular lab reevaluation with PET scan in 8 weeks.  Patient would be seen in [facility] for starting vaccination program 6 month from today REVIEW OF SYSTEMS:   GENERAL:  Feels good.  Active.  No fevers, sweats or weight loss. PERFORMANCE STATUS (ECOG):  0 HEENT:  No visual changes, runny nose, sore throat, mouth sores or tenderness. Lungs: No shortness of breath or cough.  No hemoptysis. Cardiac:  No chest pain, palpitations, orthopnea, or PND. GI:  No nausea, vomiting, diarrhea, constipation, melena or hematochezia. GU:  No urgency, frequency, dysuria, or hematuria. Musculoskeletal:  No back pain.  No joint pain.  No muscle tenderness. Extremities:  No pain or swelling. Skin:  No rashes or skin changes. Neuro:  No headache, numbness or weakness, balance or coordination issues. Endocrine:  No diabetes, thyroid issues, hot flashes or night sweats. Psych:  No mood changes, depression or anxiety. Pain:  No focal pain. Review of systems:  All other systems reviewed and found to be negative.  As per HPI. Otherwise, a complete review of systems is negatve.  PAST MEDICAL HISTORY: Past Medical  History  Diagnosis Date  . GERD (gastroesophageal reflux disease)   . Diffuse large B cell lymphoma ([place]) 05/05/2014  . Anemia   . Fatty liver unknown  . Diffuse large B cell lymphoma ([place]) 09/05/2014    PAST SURGICAL HISTORY: Past Surgical History  Procedure Laterality Date  . Upper gi endoscopy  2004    Dr Vira Agar  . Colonoscopy  2012    Dr. Vira Agar    FAMILY HISTORY Family History  Problem Relation Age of Onset  . Cancer Father     bone/brain        ADVANCED DIRECTIVES: Does not have any advance care directive   HEALTH MAINTENANCE: Social History  Substance Use Topics  . Smoking status: Never Smoker   . Smokeless tobacco: None  . Alcohol Use: No     Comment: 4/week     Allergies  Allergen Reactions  . Pollen Extract Itching    Current Outpatient Prescriptions  Medication Sig Dispense Refill  . acetaminophen (TYLENOL) 500 MG tablet Take 500 mg by mouth every 6 (six) hours as needed for mild pain.     . Cholecalciferol (VITAMIN D-3) 1000 UNITS CAPS Take 1 capsule by mouth daily.     . citalopram (CELEXA) 20 MG tablet Take 20 mg by mouth daily.    . fluticasone (FLONASE) 50 MCG/ACT nasal spray   3  . fluticasone (VERAMYST) 27.5 MCG/SPRAY nasal spray Place 2 sprays into the nose 2 (two) times daily as needed for allergies.     Marland Kitchen HYDROcodone-acetaminophen (NORCO/VICODIN) 5-325 MG per tablet Take 1 tablet by mouth every 6 (six) hours as needed for moderate pain.    Marland Kitchen lidocaine-prilocaine (EMLA) cream Apply 1 application topically as needed. 30 min prior to accessing port 30 g 3  . omeprazole (PRILOSEC) 40 MG capsule Take 40 mg by mouth daily.    . potassium chloride SA (K-DUR,KLOR-CON) 20 MEQ tablet Take by mouth.    . sulfamethoxazole-trimethoprim (BACTRIM DS,SEPTRA DS) 800-160 MG tablet Take 1 tablet by mouth twice daily on Mondays and Thursdays    . valACYclovir (VALTREX) 500 MG tablet Take 500 mg by mouth.     No current facility-administered medications  for this visit.   Facility-Administered Medications Ordered in Other Visits  Medication Dose Route Frequency Provider Last Rate Last Dose  . 0.9 %  sodium chloride infusion   Intravenous Continuous Forest Gleason, MD   Stopped at 11/11/14 1333  . sodium chloride 0.9 % injection 10 mL  10 mL Intracatheter PRN Forest Gleason, MD   10 mL at 10/22/14 1426    OBJECTIVE:  Filed Vitals:   02/16/15 0825  BP: 120/83  Pulse: 96  Temp: 96.4 F (35.8 C)     Body mass index is 26.45 kg/(m^2).    ECOG FS:0 - Asymptomatic  PHYSICAL EXAM: General  status: Performance status is good.  Patient has not lost significant weight HEENT: No evidence of stomatitis.  And alopecia Sclera and conjunctivae :: No jaundice.   pale looking . Lungs: Air  entry equal on both sides.  No rhonchi.  No rales.  Cardiac: Heart sounds are normal.  No pericardial rub.  No murmur. Lymphatic system: Cervical, axillary, inguinal, lymph nodes not palpable GI: Abdomen is soft.  No ascites.  Liver spleen not palpable.  No tenderness.  Bowel sounds are within normal limit Lower extremity: No edema Neurological system: Higher functions, cranial nerves intact No evidence of peripheral neuropathy. Skin: No rash.  No ecchymosis.Marland Kitchen     LAB RESULTS:  No visits with results within 3 Day(s) from this visit. Latest known visit with results is:  Appointment on 11/24/2014  Component Date Value Ref Range Status  . WBC 11/24/2014 9.2  3.6 - 11.0 K/uL Final  . RBC 11/24/2014 3.34* 3.80 - 5.20 MIL/uL Final  . Hemoglobin 11/24/2014 9.9* 12.0 - 16.0 g/dL Final  . HCT 11/24/2014 30.0* 35.0 - 47.0 % Final  . MCV 11/24/2014 89.9  80.0 - 100.0 fL Final  . MCH 11/24/2014 29.7  26.0 - 34.0 pg Final  . MCHC 11/24/2014 33.0  32.0 - 36.0 g/dL Final  . RDW 11/24/2014 15.7* 11.5 - 14.5 % Final  . Platelets 11/24/2014 43* 150 - 440 K/uL Final  . Neutrophils Relative % 11/24/2014 85   Final  . Neutro Abs 11/24/2014 7.7* 1.4 - 6.5 K/uL Final  .  Lymphocytes Relative 11/24/2014 7   Final  . Lymphs Abs 11/24/2014 0.7* 1.0 - 3.6 K/uL Final  . Monocytes Relative 11/24/2014 8   Final  . Monocytes Absolute 11/24/2014 0.8  0.2 - 0.9 K/uL Final  . Eosinophils Relative 11/24/2014 0   Final  . Eosinophils Absolute 11/24/2014 0.0  0 - 0.7 K/uL Final  . Basophils Relative 11/24/2014 0   Final  . 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 Patient has received high-dose chemotherapy and stem cell support Here for further follow-up posttransplant MEDICAL DECISION MAKING:  All the records from the [facility] has been reviewed Proceed with CBC metabolic panel and magnesium every 2 weeks x3  Proceed with SPECT scanning in 8 weeks for reevaluation and then patient will be examined after that. If any electrolyte imbalances that will be corrected If there is any further diarrhea patient was advised to call me  Diffuse large B cell lymphoma   Staging form: Lymphoid Neoplasms, AJCC 6th Edition     Clinical: Stage III - Marni Griffon, MD   02/16/2015 8:50 AM

## 2015-02-25 ENCOUNTER — Inpatient Hospital Stay: Payer: BC Managed Care – PPO | Admitting: Oncology

## 2015-03-02 ENCOUNTER — Inpatient Hospital Stay: Payer: BC Managed Care – PPO | Attending: Oncology

## 2015-03-02 DIAGNOSIS — C8333 Diffuse large B-cell lymphoma, intra-abdominal lymph nodes: Secondary | ICD-10-CM | POA: Diagnosis not present

## 2015-03-02 DIAGNOSIS — Z9221 Personal history of antineoplastic chemotherapy: Secondary | ICD-10-CM | POA: Insufficient documentation

## 2015-03-02 DIAGNOSIS — C8330 Diffuse large B-cell lymphoma, unspecified site: Secondary | ICD-10-CM

## 2015-03-02 LAB — COMPREHENSIVE METABOLIC PANEL
ALK PHOS: 71 U/L (ref 38–126)
ALT: 21 U/L (ref 14–54)
ANION GAP: 7 (ref 5–15)
AST: 26 U/L (ref 15–41)
Albumin: 4 g/dL (ref 3.5–5.0)
BILIRUBIN TOTAL: 0.6 mg/dL (ref 0.3–1.2)
BUN: 12 mg/dL (ref 6–20)
CALCIUM: 9 mg/dL (ref 8.9–10.3)
CO2: 24 mmol/L (ref 22–32)
CREATININE: 0.66 mg/dL (ref 0.44–1.00)
Chloride: 104 mmol/L (ref 101–111)
GFR calc non Af Amer: >60 mL/min (ref >60)
GLUCOSE: 113 mg/dL — AB (ref 65–99)
Potassium: 3.6 mmol/L (ref 3.5–5.1)
Sodium: 135 mmol/L (ref 135–145)
TOTAL PROTEIN: 6.4 g/dL — AB (ref 6.5–8.1)

## 2015-03-02 LAB — CBC WITH DIFFERENTIAL/PLATELET
Basophils Absolute: 0.1 10*3/uL (ref 0–0.1)
Basophils Relative: 1 %
Eosinophils Absolute: 0.2 10*3/uL (ref 0–0.7)
Eosinophils Relative: 4 %
HEMATOCRIT: 34.4 % — AB (ref 35.0–47.0)
HEMOGLOBIN: 11.6 g/dL — AB (ref 12.0–16.0)
LYMPHS ABS: 0.8 10*3/uL — AB (ref 1.0–3.6)
LYMPHS PCT: 16 %
MCH: 30.8 pg (ref 26.0–34.0)
MCHC: 33.9 g/dL (ref 32.0–36.0)
MCV: 90.9 fL (ref 80.0–100.0)
MONOS PCT: 13 %
Monocytes Absolute: 0.7 10*3/uL (ref 0.2–0.9)
Neutro Abs: 3.5 10*3/uL (ref 1.4–6.5)
Neutrophils Relative %: 66 %
Platelets: 171 10*3/uL (ref 150–440)
RBC: 3.78 MIL/uL — AB (ref 3.80–5.20)
RDW: 17.6 % — ABNORMAL HIGH (ref 11.5–14.5)
WBC: 5.3 10*3/uL (ref 3.6–11.0)

## 2015-03-02 LAB — MAGNESIUM: Magnesium: 1.6 mg/dL — ABNORMAL LOW (ref 1.7–2.4)

## 2015-03-13 ENCOUNTER — Other Ambulatory Visit: Payer: Self-pay | Admitting: *Deleted

## 2015-03-13 NOTE — Telephone Encounter (Signed)
Asking if [facility] will refill her Citalopram ordered by her PCP since she has not seen her in a while and has moved offices

## 2015-03-16 ENCOUNTER — Other Ambulatory Visit: Payer: BC Managed Care – PPO

## 2015-03-16 ENCOUNTER — Inpatient Hospital Stay: Payer: BC Managed Care – PPO

## 2015-03-16 MED ORDER — CITALOPRAM HYDROBROMIDE 20 MG PO TABS: 20.0000 mg | ORAL_TABLET | Freq: Every day | ORAL | Status: DC

## 2015-03-16 NOTE — Telephone Encounter (Signed)
Md agrees to fill one month supply but will need to see PCP for further refills. Patient advised of this and thanked me

## 2015-03-30 ENCOUNTER — Ambulatory Visit
Admission: RE | Admit: 2015-03-30 | Discharge: 2015-03-30 | Disposition: A | Discharge status: Home / Self Care | Payer: BC Managed Care – PPO | Source: Ambulatory Visit | Attending: Oncology | Admitting: Oncology

## 2015-03-30 ENCOUNTER — Inpatient Hospital Stay: Payer: BC Managed Care – PPO

## 2015-03-30 ENCOUNTER — Inpatient Hospital Stay: Payer: BC Managed Care – PPO | Attending: Oncology

## 2015-03-30 DIAGNOSIS — Z0189 Encounter for other specified special examinations: Secondary | ICD-10-CM | POA: Diagnosis present

## 2015-03-30 DIAGNOSIS — Z79899 Other long term (current) drug therapy: Secondary | ICD-10-CM | POA: Diagnosis not present

## 2015-03-30 DIAGNOSIS — C8333 Diffuse large B-cell lymphoma, intra-abdominal lymph nodes: Secondary | ICD-10-CM | POA: Diagnosis present

## 2015-03-30 DIAGNOSIS — R197 Diarrhea, unspecified: Secondary | ICD-10-CM | POA: Insufficient documentation

## 2015-03-30 DIAGNOSIS — D709 Neutropenia, unspecified: Secondary | ICD-10-CM | POA: Diagnosis not present

## 2015-03-30 DIAGNOSIS — C8330 Diffuse large B-cell lymphoma, unspecified site: Secondary | ICD-10-CM | POA: Insufficient documentation

## 2015-03-30 DIAGNOSIS — K639 Disease of intestine, unspecified: Secondary | ICD-10-CM | POA: Insufficient documentation

## 2015-03-30 DIAGNOSIS — D801 Nonfamilial hypogammaglobulinemia: Secondary | ICD-10-CM | POA: Diagnosis not present

## 2015-03-30 DIAGNOSIS — K219 Gastro-esophageal reflux disease without esophagitis: Secondary | ICD-10-CM | POA: Insufficient documentation

## 2015-03-30 LAB — COMPREHENSIVE METABOLIC PANEL
ALT: 25 U/L (ref 14–54)
AST: 24 U/L (ref 15–41)
Albumin: 4 g/dL (ref 3.5–5.0)
Alkaline Phosphatase: 78 U/L (ref 38–126)
Anion gap: 6 (ref 5–15)
BILIRUBIN TOTAL: 0.9 mg/dL (ref 0.3–1.2)
BUN: 14 mg/dL (ref 6–20)
CO2: 27 mmol/L (ref 22–32)
CREATININE: 0.54 mg/dL (ref 0.44–1.00)
Calcium: 9.5 mg/dL (ref 8.9–10.3)
Chloride: 108 mmol/L (ref 101–111)
Glucose, Bld: 99 mg/dL (ref 65–99)
POTASSIUM: 3.7 mmol/L (ref 3.5–5.1)
Sodium: 141 mmol/L (ref 135–145)
TOTAL PROTEIN: 6.2 g/dL — AB (ref 6.5–8.1)

## 2015-03-30 LAB — CBC WITH DIFFERENTIAL/PLATELET
BASOS ABS: 0 10*3/uL (ref 0–0.1)
Basophils Relative: 2 %
EOS PCT: 3 %
Eosinophils Absolute: 0 10*3/uL (ref 0–0.7)
HEMATOCRIT: 34.4 % — AB (ref 35.0–47.0)
Hemoglobin: 11.7 g/dL — ABNORMAL LOW (ref 12.0–16.0)
LYMPHS ABS: 0.4 10*3/uL — AB (ref 1.0–3.6)
LYMPHS PCT: 26 %
MCH: 30.8 pg (ref 26.0–34.0)
MCHC: 34 g/dL (ref 32.0–36.0)
MCV: 90.7 fL (ref 80.0–100.0)
MONO ABS: 0.2 10*3/uL (ref 0.2–0.9)
Monocytes Relative: 15 %
NEUTROS ABS: 0.9 10*3/uL — AB (ref 1.4–6.5)
Neutrophils Relative %: 54 %
PLATELETS: 178 10*3/uL (ref 150–440)
RBC: 3.79 MIL/uL — ABNORMAL LOW (ref 3.80–5.20)
RDW: 14.8 % — AB (ref 11.5–14.5)
WBC: 1.6 10*3/uL — ABNORMAL LOW (ref 3.6–11.0)

## 2015-03-30 LAB — MAGNESIUM: MAGNESIUM: 1.8 mg/dL (ref 1.7–2.4)

## 2015-03-30 LAB — GLUCOSE, CAPILLARY: GLUCOSE-CAPILLARY: 99 mg/dL (ref 65–99)

## 2015-03-30 MED ORDER — FLUDEOXYGLUCOSE F - 18 (FDG) INJECTION
12.9600 | Freq: Once | INTRAVENOUS | Status: AC | PRN
  Administered 2015-03-30: 12.96 via INTRAVENOUS

## 2015-03-30 NOTE — Progress Notes (Unsigned)
Survivorship Care Plan completed.  Treatment summary reviewed and given to patient.  ASCO answers booklet reviewed and given to patient. CARE program and Cancer Transition discussed and encouraged.  Other resources given to patient.

## 2015-03-31 ENCOUNTER — Encounter: Payer: Self-pay | Admitting: Oncology

## 2015-03-31 ENCOUNTER — Inpatient Hospital Stay (HOSPITAL_BASED_OUTPATIENT_CLINIC_OR_DEPARTMENT_OTHER): Payer: BC Managed Care – PPO | Admitting: Oncology

## 2015-03-31 VITALS — BP 109/71 | HR 84 | Temp 95.9°F | Resp 18 | Wt 160.9 lb

## 2015-03-31 DIAGNOSIS — R197 Diarrhea, unspecified: Secondary | ICD-10-CM

## 2015-03-31 DIAGNOSIS — C8330 Diffuse large B-cell lymphoma, unspecified site: Secondary | ICD-10-CM

## 2015-03-31 DIAGNOSIS — D801 Nonfamilial hypogammaglobulinemia: Secondary | ICD-10-CM

## 2015-03-31 DIAGNOSIS — Z79899 Other long term (current) drug therapy: Secondary | ICD-10-CM

## 2015-03-31 DIAGNOSIS — C8333 Diffuse large B-cell lymphoma, intra-abdominal lymph nodes: Secondary | ICD-10-CM

## 2015-03-31 DIAGNOSIS — K219 Gastro-esophageal reflux disease without esophagitis: Secondary | ICD-10-CM

## 2015-03-31 NOTE — Progress Notes (Signed)
Patient here today for PET results. 

## 2015-03-31 NOTE — Progress Notes (Signed)
[place] @ [facility] [phone]) [phone]  Fax:(336) [phone]     April Nolan OB: 03/27/1956  MR#: [id number]  FYB#:[id number]  Patient Care Team: Nicoletta Dress, MD as PCP - General (Internal Medicine) Christene Lye, MD (General Surgery) Margarita Rana, MD as Referring Physician (Family Medicine)  CHIEF COMPLAINT:  No chief complaint on file.  Oncology History   1.abnormal PET scan and CT scan Abdominal mass biopsy on 11th of January is consistent with diffuse large cell lymphoma B-cell type Fish panel was negative for any double hit  lymphoma 2.LDH is 276 3.Started on chemotherapy with R CHOP allopurinol was started few days before (May 14, 2014) patient received high-dose methotrexate after cycle 2 4, PET scan has been reviewed (April,18 th 2016) reported to be having excellent respons 5.  Patient has finished total 6 cycles of chemotherapy with R CHOP and high-dose methotrexate on the June of 2016 PET scan shows good response however still persistent disease.  (October 14, 2014) 6.  Repeat CT scan revealed progressing disease patient has been evaluated in the bone marrow transplant team and suggested to reinitiate treatment with a rise chemotherapy followed by repeat PET scan and consideration of high-dose chemotherapy with stem cell support     7.  Patient has finished high-dose chemotherapy with stem cell support in October of 2016  Oncology Flowsheet 10/21/2014 10/22/2014 11/10/2014 11/11/2014 11/11/2014 11/12/2014 11/14/2014  Day, Cycle   Day 3, Cycle 1 Day 1, Cycle 2 Day 2, Cycle 2   Day 3, Cycle 2 -  CARBOplatin (PARAPLATIN) IV   - - 590 mg   - -  cyclophosphamide (CYTOXAN) IV - - - - - - -  dexamethasone (DECADRON) IV   [ 4 mg ] [ 10 mg ] [ 12 mg ]   [ 4 mg ] -  diphenhydrAMINE (BENADRYL) PO - - 50 mg - - - -  DOXOrubicin (ADRIAMYCIN) IV - - - - - - -  etoposide (VEPESID) IV   80 mg/m2 90 mg/m2 90 mg/m2   90 mg/m2 -  fosaprepitant (EMEND) IV   - - [ 150 mg ]    - -  ifosfamide (IFEX) IV   - - [ 4,000 mg/m2 ]   - -  leucovorin (WELLCOVORIN) IV - - - - - - -  mesna (MESNEX) IV [ 4,000 mg/m2 ] - - 400 mg/m2 [ 7,400 mg ] - -  methotrexate (PF) IV - - - - - - -  ondansetron (ZOFRAN) IV - [ 8 mg ] [ 8 mg ] - - [ 8 mg ] -  palonosetron (ALOXI) IV   - - 0.25 mg   - -  pegfilgrastim (NEULASTA ONPRO KIT) [place] - 6 mg - - - - -  pegfilgrastim (NEULASTA) [place] - - - - - - 6 mg  riTUXimab (RITUXAN) IV - - 375 mg/m2 - - - -  vinCRIStine (ONCOVIN) IV - - - - - - -    INTERVAL HISTORY: 59 year old lady with a history of 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  Had a high-dose chemotherapy stem cells support. Patient continues to have loose stool. Patient is here for ongoing evaluation.  Had a repeat PET scan done.  Which has been reviewed with the patient and family.  Also reviewed independently and compared with the previous PET scan.  No chills fever appetite has been stable.  This and continues to be neutropenic REVIEW OF SYSTEMS:   GENERAL:  Feels good.  Active.  No fevers, sweats or weight loss. PERFORMANCE STATUS (ECOG):  0 HEENT:  No visual changes, runny nose, sore throat, mouth sores or tenderness.   alopecia Lungs: No shortness of breath or cough.  No hemoptysis. Cardiac:  No chest pain, palpitations, orthopnea, or PND. GI:  No nausea, vomiting, diarrhea, constipation, melena or hematochezia. GU:  No urgency, frequency, dysuria, or hematuria. Musculoskeletal:  No back pain.  No joint pain.  No muscle tenderness. Extremities:  No pain or swelling. Skin:  No rashes or skin changes. Neuro:  No headache, numbness or weakness, balance or coordination issues. Endocrine:  No diabetes, thyroid issues, hot flashes or night sweats. Psych:  No mood changes, depression or anxiety. Pain:  No focal pain. Review of systems:  All other systems reviewed and found to be negative.  As per HPI. Otherwise, a complete review of systems is  negatve.  PAST MEDICAL HISTORY: Past Medical History  Diagnosis Date  . GERD (gastroesophageal reflux disease)   . Diffuse large B cell lymphoma ([place]) 05/05/2014  . Anemia   . Fatty liver unknown  . Diffuse large B cell lymphoma ([place]) 09/05/2014    PAST SURGICAL HISTORY: Past Surgical History  Procedure Laterality Date  . Upper gi endoscopy  2004    Dr Vira Agar  . Colonoscopy  2012    Dr. Vira Agar    FAMILY HISTORY Family History  Problem Relation Age of Onset  . Cancer Father     bone/brain        ADVANCED DIRECTIVES: Does not have any advance care directive   HEALTH MAINTENANCE: Social History  Substance Use Topics  . Smoking status: Never Smoker   . Smokeless tobacco: None  . Alcohol Use: No     Comment: 4/week     Allergies  Allergen Reactions  . Pollen Extract Itching    Current Outpatient Prescriptions  Medication Sig Dispense Refill  . omeprazole (PRILOSEC) 20 MG capsule Take 20 mg by mouth.    Marland Kitchen acetaminophen (TYLENOL) 500 MG tablet Take 500 mg by mouth every 6 (six) hours as needed for mild pain.     . Cholecalciferol (VITAMIN D-3) 1000 UNITS CAPS Take 1 capsule by mouth daily.     . citalopram (CELEXA) 20 MG tablet Take 1 tablet (20 mg total) by mouth daily. 30 tablet 0  . fluticasone (FLONASE) 50 MCG/ACT nasal spray   3  . fluticasone (VERAMYST) 27.5 MCG/SPRAY nasal spray Place 2 sprays into the nose 2 (two) times daily as needed for allergies.     Marland Kitchen HYDROcodone-acetaminophen (NORCO/VICODIN) 5-325 MG per tablet Take 1 tablet by mouth every 6 (six) hours as needed for moderate pain.    Marland Kitchen lidocaine-prilocaine (EMLA) cream Apply 1 application topically as needed. 30 min prior to accessing port 30 g 3  . omeprazole (PRILOSEC) 40 MG capsule Take 40 mg by mouth daily.    . potassium chloride SA (K-DUR,KLOR-CON) 20 MEQ tablet Take by mouth.    . sulfamethoxazole-trimethoprim (BACTRIM DS,SEPTRA DS) 800-160 MG tablet Take 1 tablet by mouth twice daily on  Mondays and Thursdays    . valACYclovir (VALTREX) 500 MG tablet Take 500 mg by mouth.     No current facility-administered medications for this visit.   Facility-Administered Medications Ordered in Other Visits  Medication Dose Route Frequency Provider Last Rate Last Dose  . 0.9 %  sodium chloride infusion   Intravenous Continuous Forest Gleason, MD   Stopped at 11/11/14 1333  .  sodium chloride 0.9 % injection 10 mL  10 mL Intracatheter PRN Forest Gleason, MD   10 mL at 10/22/14 1426    OBJECTIVE:  There were no vitals filed for this visit.   There is no weight on file to calculate BMI.    ECOG FS:0 - Asymptomatic  PHYSICAL EXAM: General  status: Performance status is good.  Patient has not lost significant weight HEENT: No evidence of stomatitis.  And alopecia Sclera and conjunctivae :: No jaundice.   pale looking . Lungs: Air  entry equal on both sides.  No rhonchi.  No rales.  Cardiac: Heart sounds are normal.  No pericardial rub.  No murmur. Lymphatic system: Cervical, axillary, inguinal, lymph nodes not palpable GI: Abdomen is soft.  No ascites.  Liver spleen not palpable.  No tenderness.  Bowel sounds are within normal limit Lower extremity: No edema Neurological system: Higher functions, cranial nerves intact No evidence of peripheral neuropathy. Skin: No rash.  No ecchymosis.Marland Kitchen     LAB RESULTS:  Appointment on 03/30/2015  Component Date Value Ref Range Status  . WBC 03/30/2015 1.6* 3.6 - 11.0 K/uL Final  . RBC 03/30/2015 3.79* 3.80 - 5.20 MIL/uL Final  . Hemoglobin 03/30/2015 11.7* 12.0 - 16.0 g/dL Final  . HCT 03/30/2015 34.4* 35.0 - 47.0 % Final  . MCV 03/30/2015 90.7  80.0 - 100.0 fL Final  . MCH 03/30/2015 30.8  26.0 - 34.0 pg Final  . MCHC 03/30/2015 34.0  32.0 - 36.0 g/dL Final  . RDW 03/30/2015 14.8* 11.5 - 14.5 % Final  . Platelets 03/30/2015 178  150 - 440 K/uL Final  . Neutrophils Relative % 03/30/2015 54   Final  . Neutro Abs 03/30/2015 0.9* 1.4 - 6.5 K/uL  Final  . Lymphocytes Relative 03/30/2015 26   Final  . Lymphs Abs 03/30/2015 0.4* 1.0 - 3.6 K/uL Final  . Monocytes Relative 03/30/2015 15   Final  . Monocytes Absolute 03/30/2015 0.2  0.2 - 0.9 K/uL Final  . Eosinophils Relative 03/30/2015 3   Final  . Eosinophils Absolute 03/30/2015 0.0  0 - 0.7 K/uL Final  . Basophils Relative 03/30/2015 2   Final  . Basophils Absolute 03/30/2015 0.0  0 - 0.1 K/uL Final  . Sodium 03/30/2015 141  135 - 145 mmol/L Final  . Potassium 03/30/2015 3.7  3.5 - 5.1 mmol/L Final  . Chloride 03/30/2015 108  101 - 111 mmol/L Final  . CO2 03/30/2015 27  22 - 32 mmol/L Final  . Glucose, Bld 03/30/2015 99  65 - 99 mg/dL Final  . BUN 03/30/2015 14  6 - 20 mg/dL Final  . Creatinine, Ser 03/30/2015 0.54  0.44 - 1.00 mg/dL Final  . Calcium 03/30/2015 9.5  8.9 - 10.3 mg/dL Final  . Total Protein 03/30/2015 6.2* 6.5 - 8.1 g/dL Final  . Albumin 03/30/2015 4.0  3.5 - 5.0 g/dL Final  . AST 03/30/2015 24  15 - 41 U/L Final  . ALT 03/30/2015 25  14 - 54 U/L Final  . Alkaline Phosphatase 03/30/2015 78  38 - 126 U/L Final  . Total Bilirubin 03/30/2015 0.9  0.3 - 1.2 mg/dL Final  . GFR calc non Af Amer 03/30/2015 >60  >60 mL/min Final  . GFR calc Af Amer 03/30/2015 >60  >60 mL/min Final   Comment: (NOTE) The eGFR has been calculated using the CKD EPI equation. This calculation has not been validated in all clinical situations. eGFR's persistently <60 mL/min signify possible Chronic Kidney  Disease.   . Anion gap 03/30/2015 6  5 - 15 Final  . Magnesium 03/30/2015 1.8  1.7 - 2.4 mg/dL Final  Hospital Outpatient Visit on 03/30/2015  Component Date Value Ref Range Status  . 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 Patient has received high-dose chemotherapy and stem cell support Here for further follow-up posttransplant MEDICAL DECISION MAKING:  PET scan has been reviewed  independently and reviewed with the patient and family  It  shows complete response at present time  Review of all lab data.  Patient has hypogammaglobulinemia Also has neutropenia with absolute neutrophil count is 900 Patient was instructed not to eat uncooked meal Call me if spikes any high fever Recheck CBC in 2 weeks Reevaluation in 3 months Total duration of visit was 30   minutes.  50% or more time was spent in counseling patient and family regarding prognosis .  Overall nature of disease.  Regarding dietary restriction for not eating uncooked meals because of neutropenia and avoiding crowds.   And going back to work any issues related to that  Diffuse large B cell lymphoma   Staging form: Lymphoid Neoplasms, AJCC 6th Edition     Clinical: Stage III - April Griffon, MD   03/31/2015 8:24 AM

## 2015-04-02 ENCOUNTER — Other Ambulatory Visit: Payer: Self-pay | Admitting: Internal Medicine

## 2015-04-02 DIAGNOSIS — Z1231 Encounter for screening mammogram for malignant neoplasm of breast: Secondary | ICD-10-CM

## 2015-04-07 ENCOUNTER — Ambulatory Visit
Admission: RE | Admit: 2015-04-07 | Discharge: 2015-04-07 | Disposition: A | Discharge status: Home / Self Care | Payer: BC Managed Care – PPO | Source: Ambulatory Visit | Attending: Internal Medicine | Admitting: Internal Medicine

## 2015-04-07 DIAGNOSIS — Z1231 Encounter for screening mammogram for malignant neoplasm of breast: Secondary | ICD-10-CM

## 2015-04-08 ENCOUNTER — Other Ambulatory Visit: Payer: Self-pay | Admitting: Internal Medicine

## 2015-04-08 DIAGNOSIS — R928 Other abnormal and inconclusive findings on diagnostic imaging of breast: Secondary | ICD-10-CM

## 2015-04-08 DIAGNOSIS — N6489 Other specified disorders of breast: Secondary | ICD-10-CM

## 2015-04-13 ENCOUNTER — Other Ambulatory Visit: Payer: Self-pay | Admitting: Oncology

## 2015-04-14 ENCOUNTER — Telehealth: Payer: Self-pay | Admitting: *Deleted

## 2015-04-14 ENCOUNTER — Inpatient Hospital Stay: Payer: BC Managed Care – PPO

## 2015-04-14 DIAGNOSIS — C8333 Diffuse large B-cell lymphoma, intra-abdominal lymph nodes: Secondary | ICD-10-CM | POA: Diagnosis not present

## 2015-04-14 DIAGNOSIS — C8330 Diffuse large B-cell lymphoma, unspecified site: Secondary | ICD-10-CM

## 2015-04-14 LAB — CBC WITH DIFFERENTIAL/PLATELET
BASOS PCT: 2 %
Basophils Absolute: 0 10*3/uL (ref 0–0.1)
EOS ABS: 0 10*3/uL (ref 0–0.7)
EOS PCT: 2 %
HCT: 38.1 % (ref 35.0–47.0)
HEMOGLOBIN: 13.1 g/dL (ref 12.0–16.0)
LYMPHS ABS: 0.9 10*3/uL — AB (ref 1.0–3.6)
Lymphocytes Relative: 56 %
MCH: 30.4 pg (ref 26.0–34.0)
MCHC: 34.3 g/dL (ref 32.0–36.0)
MCV: 88.5 fL (ref 80.0–100.0)
MONO ABS: 0.6 10*3/uL (ref 0.2–0.9)
MONOS PCT: 36 %
Neutro Abs: 0.1 10*3/uL — ABNORMAL LOW (ref 1.4–6.5)
Neutrophils Relative %: 4 %
PLATELETS: 222 10*3/uL (ref 150–440)
RBC: 4.3 MIL/uL (ref 3.80–5.20)
RDW: 13.7 % (ref 11.5–14.5)
WBC: 1.6 10*3/uL — ABNORMAL LOW (ref 3.6–11.0)

## 2015-04-14 NOTE — Telephone Encounter (Signed)
Called patient to inform her that her Vincent is O.1.  Instructed her to call if she develops fever. MD will not put her on abx at this time.  She will rtc in 2 weeks for repeat labs and see MD.   Also went over neutropenic precautions with patient.  Patient verbalized understanding.

## 2015-04-14 NOTE — Telephone Encounter (Signed)
Patient is neutropenic and needs to call our clinic if develops fever. No antibiotics will be prescribed at this time but will continue to monitor pt's labs. appt will be scheduled in appx 2 weeks for pt to return to check labs and see md. Instruct pt on neutropenic precautions and to call our office if develops fever. Scheduling will call pt with appt info.

## 2015-04-30 ENCOUNTER — Ambulatory Visit
Admission: RE | Admit: 2015-04-30 | Discharge: 2015-04-30 | Disposition: A | Discharge status: Home / Self Care | Payer: BC Managed Care – PPO | Source: Ambulatory Visit | Attending: Internal Medicine | Admitting: Internal Medicine

## 2015-04-30 DIAGNOSIS — R928 Other abnormal and inconclusive findings on diagnostic imaging of breast: Secondary | ICD-10-CM

## 2015-04-30 DIAGNOSIS — N6489 Other specified disorders of breast: Secondary | ICD-10-CM | POA: Insufficient documentation

## 2015-05-01 ENCOUNTER — Other Ambulatory Visit: Payer: Self-pay | Admitting: *Deleted

## 2015-05-01 DIAGNOSIS — C8330 Diffuse large B-cell lymphoma, unspecified site: Secondary | ICD-10-CM

## 2015-05-04 ENCOUNTER — Inpatient Hospital Stay: Payer: BC Managed Care – PPO

## 2015-05-04 ENCOUNTER — Inpatient Hospital Stay: Payer: BC Managed Care – PPO | Admitting: Oncology

## 2015-05-11 ENCOUNTER — Other Ambulatory Visit: Payer: Self-pay | Admitting: Oncology

## 2015-05-12 ENCOUNTER — Inpatient Hospital Stay: Payer: BC Managed Care – PPO | Attending: Oncology

## 2015-05-12 ENCOUNTER — Encounter: Payer: Self-pay | Admitting: Oncology

## 2015-05-12 ENCOUNTER — Inpatient Hospital Stay: Payer: BC Managed Care – PPO

## 2015-05-12 ENCOUNTER — Inpatient Hospital Stay (HOSPITAL_BASED_OUTPATIENT_CLINIC_OR_DEPARTMENT_OTHER): Payer: BC Managed Care – PPO | Admitting: Oncology

## 2015-05-12 VITALS — BP 111/77 | HR 89 | Temp 96.7°F | Resp 18 | Wt 165.1 lb

## 2015-05-12 DIAGNOSIS — G4700 Insomnia, unspecified: Secondary | ICD-10-CM | POA: Insufficient documentation

## 2015-05-12 DIAGNOSIS — Z79899 Other long term (current) drug therapy: Secondary | ICD-10-CM | POA: Diagnosis not present

## 2015-05-12 DIAGNOSIS — Z9221 Personal history of antineoplastic chemotherapy: Secondary | ICD-10-CM | POA: Insufficient documentation

## 2015-05-12 DIAGNOSIS — Z9484 Stem cells transplant status: Secondary | ICD-10-CM

## 2015-05-12 DIAGNOSIS — C8333 Diffuse large B-cell lymphoma, intra-abdominal lymph nodes: Secondary | ICD-10-CM

## 2015-05-12 DIAGNOSIS — R928 Other abnormal and inconclusive findings on diagnostic imaging of breast: Secondary | ICD-10-CM | POA: Insufficient documentation

## 2015-05-12 DIAGNOSIS — K219 Gastro-esophageal reflux disease without esophagitis: Secondary | ICD-10-CM | POA: Insufficient documentation

## 2015-05-12 DIAGNOSIS — C8330 Diffuse large B-cell lymphoma, unspecified site: Secondary | ICD-10-CM

## 2015-05-12 DIAGNOSIS — K760 Fatty (change of) liver, not elsewhere classified: Secondary | ICD-10-CM | POA: Diagnosis not present

## 2015-05-12 LAB — CBC WITH DIFFERENTIAL/PLATELET
BASOS ABS: 0 10*3/uL (ref 0–0.1)
BASOS PCT: 1 %
Eosinophils Absolute: 0.1 10*3/uL (ref 0–0.7)
Eosinophils Relative: 2 %
HEMATOCRIT: 39.1 % (ref 35.0–47.0)
HEMOGLOBIN: 13.4 g/dL (ref 12.0–16.0)
LYMPHS PCT: 16 %
Lymphs Abs: 0.9 10*3/uL — ABNORMAL LOW (ref 1.0–3.6)
MCH: 29.9 pg (ref 26.0–34.0)
MCHC: 34.3 g/dL (ref 32.0–36.0)
MCV: 87.1 fL (ref 80.0–100.0)
Monocytes Absolute: 0.6 10*3/uL (ref 0.2–0.9)
Monocytes Relative: 11 %
NEUTROS ABS: 3.9 10*3/uL (ref 1.4–6.5)
NEUTROS PCT: 70 %
Platelets: 213 10*3/uL (ref 150–440)
RBC: 4.49 MIL/uL (ref 3.80–5.20)
RDW: 13.8 % (ref 11.5–14.5)
WBC: 5.5 10*3/uL (ref 3.6–11.0)

## 2015-05-12 MED ORDER — SODIUM CHLORIDE 0.9 % IJ SOLN
10.0000 mL | INTRAMUSCULAR | Status: DC | PRN
  Administered 2015-05-12: 10 mL via INTRAVENOUS
  Filled 2015-05-12: qty 10

## 2015-05-12 MED ORDER — HEPARIN SOD (PORK) LOCK FLUSH 100 UNIT/ML IV SOLN
INTRAVENOUS | Status: AC
  Filled 2015-05-12: qty 5

## 2015-05-12 MED ORDER — HEPARIN SOD (PORK) LOCK FLUSH 100 UNIT/ML IV SOLN
500.0000 [IU] | Freq: Once | INTRAVENOUS | Status: AC
  Administered 2015-05-12: 500 [IU] via INTRAVENOUS

## 2015-05-12 MED ORDER — TEMAZEPAM 15 MG PO CAPS: 15.0000 mg | ORAL_CAPSULE | Freq: Every evening | ORAL | Status: AC | PRN

## 2015-05-12 NOTE — Progress Notes (Signed)
[place] @ [facility] Telephone:(336) [phone]  Fax:(336) [place] OB: January 22, 1956  MR#: [id number]  OXB#:[id number]  Patient Care Team: Nicoletta Dress, MD as PCP - General (Internal Medicine) Christene Lye, MD (General Surgery) Margarita Rana, MD as Referring Physician (Family Medicine)  CHIEF COMPLAINT:  Chief Complaint  Patient presents with  . Lymphoma   Oncology History   1.abnormal PET scan and CT scan Abdominal mass biopsy on 11th of January is consistent with diffuse large cell lymphoma B-cell type Fish panel was negative for any double hit  lymphoma 2.LDH is 276 3.Started on chemotherapy with R CHOP allopurinol was started few days before (May 14, 2014) patient received high-dose methotrexate after cycle 2 4, PET scan has been reviewed (April,18 th 2016) reported to be having excellent respons 5.  Patient has finished total 6 cycles of chemotherapy with R CHOP and high-dose methotrexate on the June of 2016 PET scan shows good response however still persistent disease.  (October 14, 2014) 6.  Repeat CT scan revealed progressing disease patient has been evaluated in the bone marrow transplant team and suggested to reinitiate treatment with a rise chemotherapy followed by repeat PET scan and consideration of high-dose chemotherapy with stem cell support     7.  Patient has finished high-dose chemotherapy with stem cell support in October of 2016  Oncology Flowsheet 10/21/2014 10/22/2014 11/10/2014 11/11/2014 11/11/2014 11/12/2014 11/14/2014  Day, Cycle   Day 3, Cycle 1 Day 1, Cycle 2 Day 2, Cycle 2   Day 3, Cycle 2 -  CARBOplatin (PARAPLATIN) IV   - - 590 mg   - -  cyclophosphamide (CYTOXAN) IV - - - - - - -  dexamethasone (DECADRON) IV   [ 4 mg ] [ 10 mg ] [ 12 mg ]   [ 4 mg ] -  diphenhydrAMINE (BENADRYL) PO - - 50 mg - - - -  DOXOrubicin (ADRIAMYCIN) IV - - - - - - -  etoposide (VEPESID) IV   80 mg/m2 90 mg/m2 90 mg/m2   90 mg/m2 -  fosaprepitant  (EMEND) IV   - - [ 150 mg ]   - -  ifosfamide (IFEX) IV   - - [ 4,000 mg/m2 ]   - -  leucovorin (WELLCOVORIN) IV - - - - - - -  mesna (MESNEX) IV [ 4,000 mg/m2 ] - - 400 mg/m2 [ 7,400 mg ] - -  methotrexate (PF) IV - - - - - - -  ondansetron (ZOFRAN) IV - [ 8 mg ] [ 8 mg ] - - [ 8 mg ] -  palonosetron (ALOXI) IV   - - 0.25 mg   - -  pegfilgrastim (NEULASTA ONPRO KIT) [place] - 6 mg - - - - -  pegfilgrastim (NEULASTA) [place] - - - - - - 6 mg  riTUXimab (RITUXAN) IV - - 375 mg/m2 - - - -  vinCRIStine (ONCOVIN) IV - - - - - - -    INTERVAL HISTORY: 60 year old lady with a history of 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  Had a high-dose chemotherapy stem cells support. Patient continues to have loose stool. Patient is here for ongoing evaluation.  Had a repeat PET scan done.  Which has been reviewed with the patient and family.  Also reviewed independently and compared with the previous PET scan.  No chills fever appetite has been stable. During last evaluation patient was neutropenic but did not  have any episode of fever. Patient has lot of difficulty sleeping.  Patient is going back to work end of this month no nausea no vomiting no diarrhea    REVIEW OF SYSTEMS:   GENERAL:  Feels good.  Active.  No fevers, sweats or weight loss. PERFORMANCE STATUS (ECOG):  0 HEENT:  No visual changes, runny nose, sore throat, mouth sores or tenderness.   alopecia Lungs: No shortness of breath or cough.  No hemoptysis. Cardiac:  No chest pain, palpitations, orthopnea, or PND. GI:  No nausea, vomiting, diarrhea, constipation, melena or hematochezia. GU:  No urgency, frequency, dysuria, or hematuria. Musculoskeletal:  No back pain.  No joint pain.  No muscle tenderness. Extremities:  No pain or swelling. Skin:  No rashes or skin changes. Neuro:  No headache, numbness or weakness, balance or coordination issues. Endocrine:  No diabetes, thyroid issues, hot flashes or night sweats. Psych:   No mood changes, depression or anxiety. Pain:  No focal pain. Review of systems:  All other systems reviewed and found to be negative.  As per HPI. Otherwise, a complete review of systems is negatve.  PAST MEDICAL HISTORY: Past Medical History  Diagnosis Date  . GERD (gastroesophageal reflux disease)   . Anemia   . Fatty liver unknown  . Diffuse large B cell lymphoma ([place]) 05/05/2014    chemo  . Diffuse large B cell lymphoma ([place]) 09/05/2014    PAST SURGICAL HISTORY: Past Surgical History  Procedure Laterality Date  . Upper gi endoscopy  2004    Dr Vira Agar  . Colonoscopy  2012    Dr. Vira Agar    FAMILY HISTORY Family History  Problem Relation Age of Onset  . Cancer Father     bone/brain  . Breast cancer Neg Hx         ADVANCED DIRECTIVES: Does not have any advance care directive   HEALTH MAINTENANCE: Social History  Substance Use Topics  . Smoking status: Never Smoker   . Smokeless tobacco: None  . Alcohol Use: No     Comment: 4/week     Allergies  Allergen Reactions  . Pollen Extract Itching    Current Outpatient Prescriptions  Medication Sig Dispense Refill  . acetaminophen (TYLENOL) 500 MG tablet Take 500 mg by mouth every 6 (six) hours as needed for mild pain.     . citalopram (CELEXA) 20 MG tablet TAKE 1 TABLET (20 MG TOTAL) BY MOUTH DAILY. 30 tablet 0  . fluticasone (FLONASE) 50 MCG/ACT nasal spray   3  . fluticasone (VERAMYST) 27.5 MCG/SPRAY nasal spray Place 2 sprays into the nose 2 (two) times daily as needed for allergies.     Marland Kitchen lidocaine-prilocaine (EMLA) cream Apply 1 application topically as needed. 30 min prior to accessing port 30 g 3  . omeprazole (PRILOSEC) 20 MG capsule Take 20 mg by mouth.    . potassium chloride SA (K-DUR,KLOR-CON) 20 MEQ tablet Take by mouth.    . sulfamethoxazole-trimethoprim (BACTRIM DS,SEPTRA DS) 800-160 MG tablet Take 1 tablet by mouth twice daily on Mondays and Thursdays    . valACYclovir (VALTREX) 500 MG tablet  Take 500 mg by mouth.    . Vitamin D, Ergocalciferol, (DRISDOL) 50000 units CAPS capsule      No current facility-administered medications for this visit.   Facility-Administered Medications Ordered in Other Visits  Medication Dose Route Frequency Provider Last Rate Last Dose  . 0.9 %  sodium chloride infusion   Intravenous Continuous Forest Gleason, MD  Stopped at 11/11/14 1333  . sodium chloride 0.9 % injection 10 mL  10 mL Intracatheter PRN Forest Gleason, MD   10 mL at 10/22/14 1426    OBJECTIVE:  Filed Vitals:   05/12/15 1556  BP: 111/77  Pulse: 89  Temp: 96.7 F (35.9 C)  Resp: 18     Body mass index is 27.48 kg/(m^2).    ECOG FS:0 - Asymptomatic  PHYSICAL EXAM: General  status: Performance status is good.  Patient has not lost significant weight HEENT: No evidence of stomatitis.  And alopecia Sclera and conjunctivae :: No jaundice.   pale looking . Lungs: Air  entry equal on both sides.  No rhonchi.  No rales.  Cardiac: Heart sounds are normal.  No pericardial rub.  No murmur. Lymphatic system: Cervical, axillary, inguinal, lymph nodes not palpable GI: Abdomen is soft.  No ascites.  Liver spleen not palpable.  No tenderness.  Bowel sounds are within normal limit Lower extremity: No edema Neurological system: Higher functions, cranial nerves intact No evidence of peripheral neuropathy. Skin: No rash.  No ecchymosis.Marland Kitchen     LAB RESULTS:  Infusion on 05/12/2015  Component Date Value Ref Range Status  . WBC 05/12/2015 5.5  3.6 - 11.0 K/uL Final  . RBC 05/12/2015 4.49  3.80 - 5.20 MIL/uL Final  . Hemoglobin 05/12/2015 13.4  12.0 - 16.0 g/dL Final  . HCT 05/12/2015 39.1  35.0 - 47.0 % Final  . MCV 05/12/2015 87.1  80.0 - 100.0 fL Final  . MCH 05/12/2015 29.9  26.0 - 34.0 pg Final  . MCHC 05/12/2015 34.3  32.0 - 36.0 g/dL Final  . RDW 05/12/2015 13.8  11.5 - 14.5 % Final  . Platelets 05/12/2015 213  150 - 440 K/uL Final  . Neutrophils Relative % 05/12/2015 70   Final    . Neutro Abs 05/12/2015 3.9  1.4 - 6.5 K/uL Final  . Lymphocytes Relative 05/12/2015 16   Final  . Lymphs Abs 05/12/2015 0.9* 1.0 - 3.6 K/uL Final  . Monocytes Relative 05/12/2015 11   Final  . Monocytes Absolute 05/12/2015 0.6  0.2 - 0.9 K/uL Final  . Eosinophils Relative 05/12/2015 2   Final  . Eosinophils Absolute 05/12/2015 0.1  0 - 0.7 K/uL Final  . Basophils Relative 05/12/2015 1   Final  . 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 Patient has received high-dose chemotherapy and stem cell support Here for further follow-up posttransplant.  Neutropenia has resolved. He myoglobin is stable. Last PET scan was done in December.  Repeat PET scan can be done in June unless patient has any interim problem His and also had a mammogram done there was some abnormalities so special views were done but mammogram was showing   [id number] Patient is okay to go back to work Inability to sleep September as above and 15 mg was given for 15 days if it works then will be continued if not then Ambien 5-10 milligrams can be tried.  Patient is continuing Celexa..  Diffuse large B cell lymphoma   Staging form: Lymphoid Neoplasms, AJCC 6th Edition     Clinical: Stage III - Marni Griffon, MD   05/12/2015 4:05 PM

## 2015-05-12 NOTE — Progress Notes (Signed)
Patient states she is not sleeping well.

## 2015-05-13 ENCOUNTER — Encounter: Payer: Self-pay | Admitting: Oncology

## 2015-06-15 ENCOUNTER — Other Ambulatory Visit: Payer: Self-pay | Admitting: Oncology

## 2015-06-15 ENCOUNTER — Inpatient Hospital Stay: Payer: BC Managed Care – PPO | Attending: Oncology

## 2015-06-15 DIAGNOSIS — Z9221 Personal history of antineoplastic chemotherapy: Secondary | ICD-10-CM | POA: Diagnosis not present

## 2015-06-15 DIAGNOSIS — C8333 Diffuse large B-cell lymphoma, intra-abdominal lymph nodes: Secondary | ICD-10-CM | POA: Insufficient documentation

## 2015-06-15 DIAGNOSIS — Z9484 Stem cells transplant status: Secondary | ICD-10-CM | POA: Insufficient documentation

## 2015-06-15 DIAGNOSIS — C801 Malignant (primary) neoplasm, unspecified: Secondary | ICD-10-CM

## 2015-06-15 DIAGNOSIS — Z452 Encounter for adjustment and management of vascular access device: Secondary | ICD-10-CM | POA: Diagnosis not present

## 2015-06-15 MED ORDER — SODIUM CHLORIDE 0.9% FLUSH
10.0000 mL | INTRAVENOUS | Status: DC | PRN
  Administered 2015-06-15: 10 mL via INTRAVENOUS
  Filled 2015-06-15: qty 10

## 2015-06-15 MED ORDER — HEPARIN SOD (PORK) LOCK FLUSH 100 UNIT/ML IV SOLN
500.0000 [IU] | Freq: Once | INTRAVENOUS | Status: AC
  Administered 2015-06-15: 500 [IU] via INTRAVENOUS

## 2015-06-15 MED ORDER — HEPARIN SOD (PORK) LOCK FLUSH 100 UNIT/ML IV SOLN
INTRAVENOUS | Status: AC
  Filled 2015-06-15: qty 5

## 2015-06-17 ENCOUNTER — Other Ambulatory Visit: Payer: Self-pay | Admitting: Oncology

## 2015-06-30 ENCOUNTER — Inpatient Hospital Stay: Payer: BC Managed Care – PPO

## 2015-06-30 ENCOUNTER — Inpatient Hospital Stay: Payer: BC Managed Care – PPO | Attending: Oncology | Admitting: Oncology

## 2015-06-30 ENCOUNTER — Encounter: Payer: Self-pay | Admitting: Oncology

## 2015-06-30 VITALS — BP 114/78 | HR 93 | Temp 96.7°F | Resp 18 | Wt 175.1 lb

## 2015-06-30 DIAGNOSIS — C8330 Diffuse large B-cell lymphoma, unspecified site: Secondary | ICD-10-CM

## 2015-06-30 DIAGNOSIS — D219 Benign neoplasm of connective and other soft tissue, unspecified: Secondary | ICD-10-CM

## 2015-06-30 DIAGNOSIS — Z9221 Personal history of antineoplastic chemotherapy: Secondary | ICD-10-CM

## 2015-06-30 DIAGNOSIS — K219 Gastro-esophageal reflux disease without esophagitis: Secondary | ICD-10-CM | POA: Insufficient documentation

## 2015-06-30 DIAGNOSIS — C8333 Diffuse large B-cell lymphoma, intra-abdominal lymph nodes: Secondary | ICD-10-CM

## 2015-06-30 LAB — CBC WITH DIFFERENTIAL/PLATELET
Basophils Absolute: 0 10*3/uL (ref 0–0.1)
Basophils Relative: 0 %
Eosinophils Absolute: 0.1 10*3/uL (ref 0–0.7)
Eosinophils Relative: 1 %
HEMATOCRIT: 40 % (ref 35.0–47.0)
HEMOGLOBIN: 14 g/dL (ref 12.0–16.0)
LYMPHS ABS: 0.8 10*3/uL — AB (ref 1.0–3.6)
LYMPHS PCT: 11 %
MCH: 30.2 pg (ref 26.0–34.0)
MCHC: 35.1 g/dL (ref 32.0–36.0)
MCV: 86.1 fL (ref 80.0–100.0)
MONOS PCT: 8 %
Monocytes Absolute: 0.6 10*3/uL (ref 0.2–0.9)
NEUTROS ABS: 5.9 10*3/uL (ref 1.4–6.5)
NEUTROS PCT: 80 %
Platelets: 199 10*3/uL (ref 150–440)
RBC: 4.64 MIL/uL (ref 3.80–5.20)
RDW: 14.3 % (ref 11.5–14.5)
WBC: 7.4 10*3/uL (ref 3.6–11.0)

## 2015-06-30 LAB — COMPREHENSIVE METABOLIC PANEL
ALK PHOS: 105 U/L (ref 38–126)
ALT: 21 U/L (ref 14–54)
ANION GAP: 6 (ref 5–15)
AST: 20 U/L (ref 15–41)
Albumin: 4.2 g/dL (ref 3.5–5.0)
BILIRUBIN TOTAL: 0.7 mg/dL (ref 0.3–1.2)
BUN: 24 mg/dL — ABNORMAL HIGH (ref 6–20)
CALCIUM: 8.9 mg/dL (ref 8.9–10.3)
CO2: 24 mmol/L (ref 22–32)
CREATININE: 0.77 mg/dL (ref 0.44–1.00)
Chloride: 107 mmol/L (ref 101–111)
Glucose, Bld: 103 mg/dL — ABNORMAL HIGH (ref 65–99)
Potassium: 3.9 mmol/L (ref 3.5–5.1)
SODIUM: 137 mmol/L (ref 135–145)
TOTAL PROTEIN: 6.4 g/dL — AB (ref 6.5–8.1)

## 2015-06-30 LAB — MAGNESIUM: Magnesium: 1.8 mg/dL (ref 1.7–2.4)

## 2015-06-30 NOTE — Progress Notes (Signed)
[place] @ [facility] [phone]  Fax:(336) [place] OB: Dec 18, 1955  MR#: [id number]  GDJ#:[id number]  Patient Care Team: Nicoletta Dress, MD as PCP - General (Internal Medicine) Christene Lye, MD (General Surgery) Margarita Rana, MD as Referring Physician (Family Medicine)  CHIEF COMPLAINT:  Chief Complaint  Patient presents with  . Large B-Cell Lymphoma   Oncology History   1.abnormal PET scan and CT scan Abdominal mass biopsy on 11th of January is consistent with diffuse large cell lymphoma B-cell type Fish panel was negative for any double hit  lymphoma 2.LDH is 276 3.Started on chemotherapy with R CHOP allopurinol was started few days before (May 14, 2014) patient received high-dose methotrexate after cycle 2 4, PET scan has been reviewed (April,18 th 2016) reported to be having excellent respons 5.  Patient has finished total 6 cycles of chemotherapy with R CHOP and high-dose methotrexate on the June of 2016 PET scan shows good response however still persistent disease.  (October 14, 2014) 6.  Repeat CT scan revealed progressing disease patient has been evaluated in the bone marrow transplant team and suggested to reinitiate treatment with a rise chemotherapy followed by repeat PET scan and consideration of high-dose chemotherapy with stem cell support     7.  Patient has finished high-dose chemotherapy with stem cell support in October of 2016  Oncology Flowsheet 10/21/2014 10/22/2014 11/10/2014 11/11/2014 11/11/2014 11/12/2014 11/14/2014  Day, Cycle   Day 3, Cycle 1 Day 1, Cycle 2 Day 2, Cycle 2   Day 3, Cycle 2 -  CARBOplatin (PARAPLATIN) IV   - - 590 mg   - -  cyclophosphamide (CYTOXAN) IV - - - - - - -  dexamethasone (DECADRON) IV   [ 4 mg ] [ 10 mg ] [ 12 mg ]   [ 4 mg ] -  diphenhydrAMINE (BENADRYL) PO - - 50 mg - - - -  DOXOrubicin (ADRIAMYCIN) IV - - - - - - -  etoposide (VEPESID) IV   80 mg/m2 90 mg/m2 90 mg/m2   90 mg/m2 -    fosaprepitant (EMEND) IV   - - [ 150 mg ]   - -  ifosfamide (IFEX) IV   - - [ 4,000 mg/m2 ]   - -  leucovorin (WELLCOVORIN) IV - - - - - - -  mesna (MESNEX) IV [ 4,000 mg/m2 ] - - 400 mg/m2 [ 7,400 mg ] - -  methotrexate (PF) IV - - - - - - -  ondansetron (ZOFRAN) IV - [ 8 mg ] [ 8 mg ] - - [ 8 mg ] -  palonosetron (ALOXI) IV   - - 0.25 mg   - -  pegfilgrastim (NEULASTA ONPRO KIT) [place] - 6 mg - - - - -  pegfilgrastim (NEULASTA) [place] - - - - - - 6 mg  riTUXimab (RITUXAN) IV - - 375 mg/m2 - - - -  vinCRIStine (ONCOVIN) IV - - - - - - -    INTERVAL HISTORY: 60 year old lady with a history of 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  Patient is here for further evaluation regarding her recurrent lymphoma.  Status post chemotherapy and high-dose chemotherapy with stem cell support. Patient is gradually recovering from the side effect of high-dose chemotherapy.  No nausea.  No vomiting.  Myelosuppression is improved patient is back to work.  Alopecia is slowly resolving. Patient had a mammogram done in December which was  slightly abnormal so repeat tomogram was done which has been reported to be negative.  She was advised to get screening mammogram once a year No nausea no vomiting no diarrhea appetite has been stable no chills or fever    REVIEW OF SYSTEMS:   GENERAL:  Feels good.  Active.  No fevers, sweats or weight loss. PERFORMANCE STATUS (ECOG):  0 HEENT:  No visual changes, runny nose, sore throat, mouth sores or tenderness.   alopecia Lungs: No shortness of breath or cough.  No hemoptysis. Cardiac:  No chest pain, palpitations, orthopnea, or PND. GI:  No nausea, vomiting, diarrhea, constipation, melena or hematochezia. GU:  No urgency, frequency, dysuria, or hematuria. Musculoskeletal:  No back pain.  No joint pain.  No muscle tenderness. Extremities:  No pain or swelling. Skin:  No rashes or skin changes. Neuro:  No headache, numbness or weakness, balance or  coordination issues. Endocrine:  No diabetes, thyroid issues, hot flashes or night sweats. Psych:  No mood changes, depression or anxiety. Pain:  No focal pain. Review of systems:  All other systems reviewed and found to be negative.  As per HPI. Otherwise, a complete review of systems is negatve.  PAST MEDICAL HISTORY: Past Medical History  Diagnosis Date  . GERD (gastroesophageal reflux disease)   . Anemia   . Fatty liver unknown  . Diffuse large B cell lymphoma ([place]) 05/05/2014    chemo  . Diffuse large B cell lymphoma ([place]) 09/05/2014    PAST SURGICAL HISTORY: Past Surgical History  Procedure Laterality Date  . Upper gi endoscopy  2004    Dr Vira Agar  . Colonoscopy  2012    Dr. Vira Agar    FAMILY HISTORY Family History  Problem Relation Age of Onset  . Cancer Father     bone/brain  . Breast cancer Neg Hx         ADVANCED DIRECTIVES: Does not have any advance care directive   HEALTH MAINTENANCE: Social History  Substance Use Topics  . Smoking status: Never Smoker   . Smokeless tobacco: None  . Alcohol Use: No     Comment: 4/week     Allergies  Allergen Reactions  . Pollen Extract Itching      OBJECTIVE:  Filed Vitals:   06/30/15 1006  BP: 114/78  Pulse: 93  Temp: 96.7 F (35.9 C)  Resp: 18     Body mass index is 29.14 kg/(m^2).    ECOG FS:0 - Asymptomatic  PHYSICAL EXAM: General  status: Performance status is good.  Patient has not lost significant weight HEENT: No evidence of stomatitis.  And alopecia Sclera and conjunctivae :: No jaundice.   pale looking . Lungs: Air  entry equal on both sides.  No rhonchi.  No rales.  Cardiac: Heart sounds are normal.  No pericardial rub.  No murmur. Lymphatic system: Cervical, axillary, inguinal, lymph nodes not palpable GI: Abdomen is soft.  No ascites.  Liver spleen not palpable.  No tenderness.  Bowel sounds are within normal limit Lower extremity: No edema Neurological system: Higher functions,  cranial nerves intact No evidence of peripheral neuropathy. Skin: No rash.  No ecchymosis.Marland Kitchen     LAB RESULTS:  Appointment on 06/30/2015  Component Date Value Ref Range Status  . WBC 06/30/2015 7.4  3.6 - 11.0 K/uL Final  . RBC 06/30/2015 4.64  3.80 - 5.20 MIL/uL Final  . Hemoglobin 06/30/2015 14.0  12.0 - 16.0 g/dL Final  . HCT 06/30/2015 40.0  35.0 - 47.0 %  Final  . MCV 06/30/2015 86.1  80.0 - 100.0 fL Final  . MCH 06/30/2015 30.2  26.0 - 34.0 pg Final  . MCHC 06/30/2015 35.1  32.0 - 36.0 g/dL Final  . RDW 06/30/2015 14.3  11.5 - 14.5 % Final  . Platelets 06/30/2015 199  150 - 440 K/uL Final  . Neutrophils Relative % 06/30/2015 80   Final  . Neutro Abs 06/30/2015 5.9  1.4 - 6.5 K/uL Final  . Lymphocytes Relative 06/30/2015 11   Final  . Lymphs Abs 06/30/2015 0.8* 1.0 - 3.6 K/uL Final  . Monocytes Relative 06/30/2015 8   Final  . Monocytes Absolute 06/30/2015 0.6  0.2 - 0.9 K/uL Final  . Eosinophils Relative 06/30/2015 1   Final  . Eosinophils Absolute 06/30/2015 0.1  0 - 0.7 K/uL Final  . Basophils Relative 06/30/2015 0   Final  . Basophils Absolute 06/30/2015 0.0  0 - 0.1 K/uL Final  . Sodium 06/30/2015 137  135 - 145 mmol/L Final  . Potassium 06/30/2015 3.9  3.5 - 5.1 mmol/L Final  . Chloride 06/30/2015 107  101 - 111 mmol/L Final  . CO2 06/30/2015 24  22 - 32 mmol/L Final  . Glucose, Bld 06/30/2015 103* 65 - 99 mg/dL Final  . BUN 06/30/2015 24* 6 - 20 mg/dL Final  . Creatinine, Ser 06/30/2015 0.77  0.44 - 1.00 mg/dL Final  . Calcium 06/30/2015 8.9  8.9 - 10.3 mg/dL Final  . Total Protein 06/30/2015 6.4* 6.5 - 8.1 g/dL Final  . Albumin 06/30/2015 4.2  3.5 - 5.0 g/dL Final  . AST 06/30/2015 20  15 - 41 U/L Final  . ALT 06/30/2015 21  14 - 54 U/L Final  . Alkaline Phosphatase 06/30/2015 105  38 - 126 U/L Final  . Total Bilirubin 06/30/2015 0.7  0.3 - 1.2 mg/dL Final  . GFR calc non Af Amer 06/30/2015 >60  >60 mL/min Final  . GFR calc Af Amer 06/30/2015 >60  >60 mL/min  Final   Comment: (NOTE) The eGFR has been calculated using the CKD EPI equation. This calculation has not been validated in all clinical situations. eGFR's persistently <60 mL/min signify possible Chronic Kidney Disease.   . Anion gap 06/30/2015 6  5 - 15 Final  . 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 Status post high-dose chemotherapy  with stem cell support No evidence of recurrent disease on clinical examination Last PET scan was done in December so will be repeated in first week of June. Lab data has been reviewed  Diffuse large B cell lymphoma   Staging form: Lymphoid Neoplasms, AJCC 6th Edition     Clinical: Stage III - Marni Griffon, MD   06/30/2015 10:31 AM

## 2015-07-14 ENCOUNTER — Other Ambulatory Visit: Payer: Self-pay | Admitting: Oncology

## 2015-07-27 ENCOUNTER — Inpatient Hospital Stay: Payer: BC Managed Care – PPO | Attending: Oncology

## 2015-07-27 DIAGNOSIS — Z9221 Personal history of antineoplastic chemotherapy: Secondary | ICD-10-CM | POA: Insufficient documentation

## 2015-07-27 DIAGNOSIS — K432 Incisional hernia without obstruction or gangrene: Secondary | ICD-10-CM | POA: Insufficient documentation

## 2015-07-27 DIAGNOSIS — K219 Gastro-esophageal reflux disease without esophagitis: Secondary | ICD-10-CM | POA: Insufficient documentation

## 2015-07-27 DIAGNOSIS — R5383 Other fatigue: Secondary | ICD-10-CM | POA: Diagnosis not present

## 2015-07-27 DIAGNOSIS — R110 Nausea: Secondary | ICD-10-CM | POA: Diagnosis not present

## 2015-07-27 DIAGNOSIS — R1013 Epigastric pain: Secondary | ICD-10-CM | POA: Diagnosis not present

## 2015-07-27 DIAGNOSIS — C8333 Diffuse large B-cell lymphoma, intra-abdominal lymph nodes: Secondary | ICD-10-CM | POA: Insufficient documentation

## 2015-07-27 DIAGNOSIS — Z452 Encounter for adjustment and management of vascular access device: Secondary | ICD-10-CM | POA: Diagnosis not present

## 2015-07-27 DIAGNOSIS — Z95828 Presence of other vascular implants and grafts: Secondary | ICD-10-CM

## 2015-07-27 MED ORDER — SODIUM CHLORIDE 0.9% FLUSH
10.0000 mL | INTRAVENOUS | Status: DC | PRN
  Administered 2015-07-27: 10 mL via INTRAVENOUS
  Filled 2015-07-27: qty 10

## 2015-07-27 MED ORDER — HEPARIN SOD (PORK) LOCK FLUSH 100 UNIT/ML IV SOLN
500.0000 [IU] | Freq: Once | INTRAVENOUS | Status: AC
  Administered 2015-07-27: 500 [IU] via INTRAVENOUS

## 2015-08-19 ENCOUNTER — Telehealth: Payer: Self-pay | Admitting: *Deleted

## 2015-08-19 DIAGNOSIS — R197 Diarrhea, unspecified: Secondary | ICD-10-CM

## 2015-08-19 DIAGNOSIS — C8300 Small cell B-cell lymphoma, unspecified site: Secondary | ICD-10-CM

## 2015-08-19 DIAGNOSIS — G43A0 Cyclical vomiting, not intractable: Secondary | ICD-10-CM

## 2015-08-19 NOTE — Telephone Encounter (Signed)
Having cyclic episodes of abd discomfort, vomiting and diarrhea, Denies fever, abd discomfort is worse when she wears clothing which is tighter. She states it happened 2 weeks ago and lasted a day or 2 and it has come back yesterday. Asking if anything needs to be done about it

## 2015-08-19 NOTE — Telephone Encounter (Signed)
Appt for tomorrow agreed on for lab/ md per VO Dr Oliva Bustard

## 2015-08-20 ENCOUNTER — Inpatient Hospital Stay (HOSPITAL_BASED_OUTPATIENT_CLINIC_OR_DEPARTMENT_OTHER): Payer: BC Managed Care – PPO | Admitting: Oncology

## 2015-08-20 ENCOUNTER — Encounter: Payer: Self-pay | Admitting: Oncology

## 2015-08-20 ENCOUNTER — Inpatient Hospital Stay: Payer: BC Managed Care – PPO

## 2015-08-20 VITALS — BP 110/77 | HR 97 | Temp 97.3°F | Resp 18 | Wt 171.5 lb

## 2015-08-20 DIAGNOSIS — Z9221 Personal history of antineoplastic chemotherapy: Secondary | ICD-10-CM

## 2015-08-20 DIAGNOSIS — R5383 Other fatigue: Secondary | ICD-10-CM

## 2015-08-20 DIAGNOSIS — K219 Gastro-esophageal reflux disease without esophagitis: Secondary | ICD-10-CM

## 2015-08-20 DIAGNOSIS — R1013 Epigastric pain: Secondary | ICD-10-CM

## 2015-08-20 DIAGNOSIS — C8333 Diffuse large B-cell lymphoma, intra-abdominal lymph nodes: Secondary | ICD-10-CM | POA: Diagnosis not present

## 2015-08-20 DIAGNOSIS — G43A0 Cyclical vomiting, not intractable: Secondary | ICD-10-CM

## 2015-08-20 DIAGNOSIS — C8300 Small cell B-cell lymphoma, unspecified site: Secondary | ICD-10-CM

## 2015-08-20 DIAGNOSIS — R197 Diarrhea, unspecified: Secondary | ICD-10-CM

## 2015-08-20 DIAGNOSIS — D432 Neoplasm of uncertain behavior of brain, unspecified: Secondary | ICD-10-CM

## 2015-08-20 DIAGNOSIS — R110 Nausea: Secondary | ICD-10-CM

## 2015-08-20 LAB — COMPREHENSIVE METABOLIC PANEL
ALBUMIN: 4.1 g/dL (ref 3.5–5.0)
ALK PHOS: 79 U/L (ref 38–126)
ALT: 42 U/L (ref 14–54)
ANION GAP: 8 (ref 5–15)
AST: 44 U/L — AB (ref 15–41)
BUN: 17 mg/dL (ref 6–20)
CALCIUM: 9 mg/dL (ref 8.9–10.3)
CO2: 25 mmol/L (ref 22–32)
Chloride: 105 mmol/L (ref 101–111)
Creatinine, Ser: 0.88 mg/dL (ref 0.44–1.00)
GFR calc Af Amer: >60 mL/min (ref >60)
GFR calc non Af Amer: >60 mL/min (ref >60)
GLUCOSE: 112 mg/dL — AB (ref 65–99)
Potassium: 3.2 mmol/L — ABNORMAL LOW (ref 3.5–5.1)
SODIUM: 138 mmol/L (ref 135–145)
Total Bilirubin: 1.8 mg/dL — ABNORMAL HIGH (ref 0.3–1.2)
Total Protein: 6.8 g/dL (ref 6.5–8.1)

## 2015-08-20 LAB — CBC WITH DIFFERENTIAL/PLATELET
Basophils Absolute: 0 10*3/uL (ref 0–0.1)
Basophils Relative: 0 %
EOS ABS: 0 10*3/uL (ref 0–0.7)
Eosinophils Relative: 1 %
HCT: 37.9 % (ref 35.0–47.0)
HEMOGLOBIN: 13.2 g/dL (ref 12.0–16.0)
LYMPHS ABS: 0.5 10*3/uL — AB (ref 1.0–3.6)
LYMPHS PCT: 14 %
MCH: 30.3 pg (ref 26.0–34.0)
MCHC: 34.8 g/dL (ref 32.0–36.0)
MCV: 87.1 fL (ref 80.0–100.0)
Monocytes Absolute: 0.8 10*3/uL (ref 0.2–0.9)
Monocytes Relative: 21 %
NEUTROS PCT: 64 %
Neutro Abs: 2.4 10*3/uL (ref 1.4–6.5)
Platelets: 169 10*3/uL (ref 150–440)
RBC: 4.35 MIL/uL (ref 3.80–5.20)
RDW: 14.3 % (ref 11.5–14.5)
WBC: 3.8 10*3/uL (ref 3.6–11.0)

## 2015-08-20 LAB — LACTATE DEHYDROGENASE: LDH: 175 U/L (ref 98–192)

## 2015-08-20 NOTE — Progress Notes (Signed)
Patient states she has been experiencing pain in her abdomen.  Notices it more when her clothes are tighter.  States she gets fatigued when walking stairs.  Sleeps a lot.  Appetite decreased.

## 2015-08-20 NOTE — Progress Notes (Signed)
[place] @ [facility] [phone]) [phone]  Fax:(336) [place] OB: 1955/09/17  MR#: [id number]  ESL#:[id number]  Patient Care Team: Nicoletta Dress, MD as PCP - General (Internal Medicine) Christene Lye, MD (General Surgery) Margarita Rana, MD as Referring Physician (Family Medicine)  CHIEF COMPLAINT:  No chief complaint on file.  Oncology History   1.abnormal PET scan and CT scan Abdominal mass biopsy on 11th of January is consistent with diffuse large cell lymphoma B-cell type Fish panel was negative for any double hit  lymphoma 2.LDH is 276 3.Started on chemotherapy with R CHOP allopurinol was started few days before (May 14, 2014) patient received high-dose methotrexate after cycle 2 4, PET scan has been reviewed (April,18 th 2016) reported to be having excellent respons 5.  Patient has finished total 6 cycles of chemotherapy with R CHOP and high-dose methotrexate on the June of 2016 PET scan shows good response however still persistent disease.  (October 14, 2014) 6.  Repeat CT scan revealed progressing disease patient has been evaluated in the bone marrow transplant team and suggested to reinitiate treatment with a rise chemotherapy followed by repeat PET scan and consideration of high-dose chemotherapy with stem cell support     7.  Patient has finished high-dose chemotherapy with stem cell support in October of 2016    INTERVAL HISTORY: 60 year old lady with a history of 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  Patient is here for further evaluation regarding her recurrent lymphoma.  Status post chemotherapy and high-dose chemotherapy with stem cell support. Patient started having some abdominal discomfort off and on.  Epigastric discomfort.  Persistent nausea.  Here for further follow-up patient has been scheduled to have PET scan done in June    Patient states she has been experiencing pain in her abdomen. Notices it more  when her clothes are tighter. States she gets fatigued when walking stairs. Sleeps a lot. Appetite decreased     REVIEW OF SYSTEMS:   GENERAL:  Feels good.  Active.  No fevers, sweats or weight loss. PERFORMANCE STATUS (ECOG):  0 HEENT:  No visual changes, runny nose, sore throat, mouth sores or tenderness.   alopecia Lungs: No shortness of breath or cough.  No hemoptysis. Cardiac:  No chest pain, palpitations, orthopnea, or PND. GI:  No nausea, vomiting, diarrhea, constipation, melena or hematochezia. GU:  No urgency, frequency, dysuria, or hematuria. Musculoskeletal:  No back pain.  No joint pain.  No muscle tenderness. Extremities:  No pain or swelling. Skin:  No rashes or skin changes. Neuro:  No headache, numbness or weakness, balance or coordination issues. Endocrine:  No diabetes, thyroid issues, hot flashes or night sweats. Psych:  No mood changes, depression or anxiety. Pain:  No focal pain. Review of systems:  All other systems reviewed and found to be negative.  As per HPI. Otherwise, a complete review of systems is negatve.  PAST MEDICAL HISTORY: Past Medical History  Diagnosis Date  . GERD (gastroesophageal reflux disease)   . Anemia   . Fatty liver unknown  . Diffuse large B cell lymphoma ([place]) 05/05/2014    chemo  . Diffuse large B cell lymphoma ([place]) 09/05/2014    PAST SURGICAL HISTORY: Past Surgical History  Procedure Laterality Date  . Upper gi endoscopy  2004    Dr Vira Agar  . Colonoscopy  2012    Dr. Vira Agar    FAMILY HISTORY Family History  Problem Relation Age of Onset  .  Cancer Father     bone/brain  . Breast cancer Neg Hx         ADVANCED DIRECTIVES: Does not have any advance care directive   HEALTH MAINTENANCE: Social History  Substance Use Topics  . Smoking status: Never Smoker   . Smokeless tobacco: None  . Alcohol Use: No     Comment: 4/week     Allergies  Allergen Reactions  . Pollen Extract Itching       OBJECTIVE:  There were no vitals filed for this visit.   There is no weight on file to calculate BMI.    ECOG FS:0 - Asymptomatic  PHYSICAL EXAM: General  status: Performance status is good.  Patient has not lost significant weight HEENT: No evidence of stomatitis.   Sclera and conjunctivae :: No jaundice.   pale looking . Lungs: Air  entry equal on both sides.  No rhonchi.  No rales.  Cardiac: Heart sounds are normal.  No pericardial rub.  No murmur. Lymphatic system: Cervical, axillary, inguinal, lymph nodes not palpable GI: Abdomen is soft.  No ascites.  Liver spleen not palpable.  No tenderness.  Bowel sounds are within normal limit  Incisional hernia in upper abdominal area Lower extremity: No edema Neurological system: Higher functions, cranial nerves intact No evidence of peripheral neuropathy. Skin: No rash.  No ecchymosis.Marland Kitchen     LAB RESULTS:  No visits with results within 3 Day(s) from this visit. Latest known visit with results is:  Appointment on 06/30/2015  Component Date Value Ref Range Status  . WBC 06/30/2015 7.4  3.6 - 11.0 K/uL Final  . RBC 06/30/2015 4.64  3.80 - 5.20 MIL/uL Final  . Hemoglobin 06/30/2015 14.0  12.0 - 16.0 g/dL Final  . HCT 06/30/2015 40.0  35.0 - 47.0 % Final  . MCV 06/30/2015 86.1  80.0 - 100.0 fL Final  . MCH 06/30/2015 30.2  26.0 - 34.0 pg Final  . MCHC 06/30/2015 35.1  32.0 - 36.0 g/dL Final  . RDW 06/30/2015 14.3  11.5 - 14.5 % Final  . Platelets 06/30/2015 199  150 - 440 K/uL Final  . Neutrophils Relative % 06/30/2015 80   Final  . Neutro Abs 06/30/2015 5.9  1.4 - 6.5 K/uL Final  . Lymphocytes Relative 06/30/2015 11   Final  . Lymphs Abs 06/30/2015 0.8* 1.0 - 3.6 K/uL Final  . Monocytes Relative 06/30/2015 8   Final  . Monocytes Absolute 06/30/2015 0.6  0.2 - 0.9 K/uL Final  . Eosinophils Relative 06/30/2015 1   Final  . Eosinophils Absolute 06/30/2015 0.1  0 - 0.7 K/uL Final  . Basophils Relative 06/30/2015 0   Final   . Basophils Absolute 06/30/2015 0.0  0 - 0.1 K/uL Final  . Sodium 06/30/2015 137  135 - 145 mmol/L Final  . Potassium 06/30/2015 3.9  3.5 - 5.1 mmol/L Final  . Chloride 06/30/2015 107  101 - 111 mmol/L Final  . CO2 06/30/2015 24  22 - 32 mmol/L Final  . Glucose, Bld 06/30/2015 103* 65 - 99 mg/dL Final  . BUN 06/30/2015 24* 6 - 20 mg/dL Final  . Creatinine, Ser 06/30/2015 0.77  0.44 - 1.00 mg/dL Final  . Calcium 06/30/2015 8.9  8.9 - 10.3 mg/dL Final  . Total Protein 06/30/2015 6.4* 6.5 - 8.1 g/dL Final  . Albumin 06/30/2015 4.2  3.5 - 5.0 g/dL Final  . AST 06/30/2015 20  15 - 41 U/L Final  . ALT 06/30/2015 21  14 -  54 U/L Final  . Alkaline Phosphatase 06/30/2015 105  38 - 126 U/L Final  . Total Bilirubin 06/30/2015 0.7  0.3 - 1.2 mg/dL Final  . GFR calc non Af Amer 06/30/2015 >60  >60 mL/min Final  . GFR calc Af Amer 06/30/2015 >60  >60 mL/min Final   Comment: (NOTE) The eGFR has been calculated using the CKD EPI equation. This calculation has not been validated in all clinical situations. eGFR's persistently <60 mL/min signify possible Chronic Kidney Disease.   . Anion gap 06/30/2015 6  5 - 15 Final  . 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 Status post high-dose chemotherapy  with stem cell support 2, increasing abdominal discomfort.  Patient has been scheduled to have a PET scan done in June and we will do it before because of progressing symptoms   3.incisional hernia   Diffuse large B cell lymphoma   Staging form: Lymphoid Neoplasms, AJCC 6th Edition     Clinical: Stage III - Marni Griffon, MD   08/20/2015 9:18 AM

## 2015-08-27 ENCOUNTER — Encounter
Admission: RE | Admit: 2015-08-27 | Discharge: 2015-08-27 | Disposition: A | Discharge status: Home / Self Care | Payer: BC Managed Care – PPO | Source: Ambulatory Visit | Attending: Oncology | Admitting: Oncology

## 2015-08-27 DIAGNOSIS — C8330 Diffuse large B-cell lymphoma, unspecified site: Secondary | ICD-10-CM | POA: Diagnosis not present

## 2015-08-27 LAB — GLUCOSE, CAPILLARY: GLUCOSE-CAPILLARY: 104 mg/dL — AB (ref 65–99)

## 2015-08-27 MED ORDER — FLUDEOXYGLUCOSE F - 18 (FDG) INJECTION
12.0100 | Freq: Once | INTRAVENOUS | Status: AC | PRN
  Administered 2015-08-27: 12.01 via INTRAVENOUS

## 2015-08-28 ENCOUNTER — Telehealth: Payer: Self-pay | Admitting: *Deleted

## 2015-08-28 NOTE — Telephone Encounter (Signed)
Asking for PET Results

## 2015-08-31 NOTE — Telephone Encounter (Signed)
Message left of nml results

## 2015-09-02 ENCOUNTER — Inpatient Hospital Stay: Payer: BC Managed Care – PPO

## 2015-09-02 ENCOUNTER — Inpatient Hospital Stay (HOSPITAL_BASED_OUTPATIENT_CLINIC_OR_DEPARTMENT_OTHER): Payer: BC Managed Care – PPO | Admitting: Oncology

## 2015-09-02 ENCOUNTER — Inpatient Hospital Stay: Payer: BC Managed Care – PPO | Attending: Oncology

## 2015-09-02 VITALS — BP 111/76 | HR 89 | Temp 96.9°F | Resp 18 | Wt 175.0 lb

## 2015-09-02 DIAGNOSIS — Z9221 Personal history of antineoplastic chemotherapy: Secondary | ICD-10-CM | POA: Diagnosis not present

## 2015-09-02 DIAGNOSIS — R1013 Epigastric pain: Secondary | ICD-10-CM

## 2015-09-02 DIAGNOSIS — K219 Gastro-esophageal reflux disease without esophagitis: Secondary | ICD-10-CM | POA: Insufficient documentation

## 2015-09-02 DIAGNOSIS — K432 Incisional hernia without obstruction or gangrene: Secondary | ICD-10-CM | POA: Diagnosis not present

## 2015-09-02 DIAGNOSIS — C8330 Diffuse large B-cell lymphoma, unspecified site: Secondary | ICD-10-CM

## 2015-09-02 DIAGNOSIS — R61 Generalized hyperhidrosis: Secondary | ICD-10-CM | POA: Diagnosis not present

## 2015-09-02 DIAGNOSIS — Z452 Encounter for adjustment and management of vascular access device: Secondary | ICD-10-CM | POA: Diagnosis not present

## 2015-09-02 DIAGNOSIS — Z95828 Presence of other vascular implants and grafts: Secondary | ICD-10-CM

## 2015-09-02 MED ORDER — HEPARIN SOD (PORK) LOCK FLUSH 100 UNIT/ML IV SOLN
500.0000 [IU] | Freq: Once | INTRAVENOUS | Status: AC
  Administered 2015-09-02: 500 [IU] via INTRAVENOUS

## 2015-09-02 MED ORDER — SODIUM CHLORIDE 0.9% FLUSH
10.0000 mL | INTRAVENOUS | Status: DC | PRN
  Administered 2015-09-02: 10 mL via INTRAVENOUS
  Filled 2015-09-02: qty 10

## 2015-09-02 NOTE — Progress Notes (Signed)
Patient states she has had a few night sweats since her last visit.  States she is just damp, not drenched.

## 2015-09-03 ENCOUNTER — Encounter: Payer: Self-pay | Admitting: Oncology

## 2015-09-03 NOTE — Progress Notes (Signed)
[place] @ [facility] [phone]) [phone]  Fax:(336) [phone]     [name] OB: [birth year]  MR#: [id number]  [id number]  Patient Care Team: Nicoletta Dress, MD as PCP - General (Internal Medicine) Christene Lye, MD (General Surgery) Margarita Rana, MD as Referring Physician (Family Medicine)  CHIEF COMPLAINT:  Chief Complaint  Patient presents with  . B-Cell Lymphoma   Oncology History   1.abnormal PET scan and CT scan Abdominal mass biopsy on 11th of January is consistent with diffuse large cell lymphoma B-cell type Fish panel was negative for any double hit  lymphoma 2.LDH is 276 3.Started on chemotherapy with R CHOP allopurinol was started few days before (May 14, 2014) patient received high-dose methotrexate after cycle 2 4, PET scan has been reviewed (April,18 th 2016) reported to be having excellent respons 5.  Patient has finished total 6 cycles of chemotherapy with R CHOP and high-dose methotrexate on the June of 2016 PET scan shows good response however still persistent disease.  (October 14, 2014) 6.  Repeat CT scan revealed progressing disease patient has been evaluated in the bone marrow transplant team and suggested to reinitiate treatment with a rise chemotherapy followed by repeat PET scan and consideration of high-dose chemotherapy with stem cell support     7.  Patient has finished high-dose chemotherapy with stem cell support in October of 2016    INTERVAL HISTORY: 60 year old lady with a history of 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  Patient is here for further evaluation regarding her recurrent lymphoma.  Status post chemotherapy and high-dose chemotherapy with stem cell support. Patient started having some abdominal discomfort off and on.  Epigastric discomfort.  Persistent nausea.  Here for further follow-up patient has been scheduled to have PET scan done in June  Patient was evaluated few days ago because of  increasing abdominal discomfort.  Had a repeat PET scan done which has been evaluated independently.  By me and also with patient.  Abdominal pain has resolved.  No nausea no vomiting patient is employed full-time.         Patient states she has had a few night sweats since her last visit. States she is just damp, not drenched     REVIEW OF SYSTEMS:   GENERAL:  Feels good.  Active.  No fevers, sweats or weight loss. PERFORMANCE STATUS (ECOG):  0 HEENT:  No visual changes, runny nose, sore throat, mouth sores or tenderness.   alopecia Lungs: No shortness of breath or cough.  No hemoptysis. Cardiac:  No chest pain, palpitations, orthopnea, or PND. GI:  No nausea, vomiting, diarrhea, constipation, melena or hematochezia. GU:  No urgency, frequency, dysuria, or hematuria. Musculoskeletal:  No back pain.  No joint pain.  No muscle tenderness. Extremities:  No pain or swelling. Skin:  No rashes or skin changes. Neuro:  No headache, numbness or weakness, balance or coordination issues. Endocrine:  No diabetes, thyroid issues, hot flashes or night sweats. Psych:  No mood changes, depression or anxiety. Pain:  No focal pain. Review of systems:  All other systems reviewed and found to be negative.  As per HPI. Otherwise, a complete review of systems is negatve.  PAST MEDICAL HISTORY: Past Medical History  Diagnosis Date  . GERD (gastroesophageal reflux disease)   . Anemia   . Fatty liver unknown  . Diffuse large B cell lymphoma ([place]) 05/05/2014    chemo  . Diffuse large B cell lymphoma ([place]) 09/05/2014  PAST SURGICAL HISTORY: Past Surgical History  Procedure Laterality Date  . Upper gi endoscopy  2004    Dr Vira Agar  . Colonoscopy  2012    Dr. Vira Agar    FAMILY HISTORY Family History  Problem Relation Age of Onset  . Cancer Father     bone/brain  . Breast cancer Neg Hx         ADVANCED DIRECTIVES: Does not have any advance care directive   HEALTH  MAINTENANCE: Social History  Substance Use Topics  . Smoking status: Never Smoker   . Smokeless tobacco: None  . Alcohol Use: No     Comment: 4/week     Allergies  Allergen Reactions  . Pollen Extract Itching      OBJECTIVE:  Filed Vitals:   09/02/15 1039  BP: 111/76  Pulse: 89  Temp: 96.9 F (36.1 C)  Resp: 18     Body mass index is 29.13 kg/(m^2).    ECOG FS:0 - Asymptomatic  PHYSICAL EXAM: General  status: Performance status is good.  Patient has not lost significant weight HEENT: No evidence of stomatitis.   Sclera and conjunctivae :: No jaundice.   pale looking . Lungs: Air  entry equal on both sides.  No rhonchi.  No rales.  Cardiac: Heart sounds are normal.  No pericardial rub.  No murmur. Lymphatic system: Cervical, axillary, inguinal, lymph nodes not palpable GI: Abdomen is soft.  No ascites.  Liver spleen not palpable.  No tenderness.  Bowel sounds are within normal limit  Incisional hernia in upper abdominal area Lower extremity: No edema Neurological system: Higher functions, cranial nerves intact No evidence of peripheral neuropathy. Skin: No rash.  No ecchymosis.Marland Kitchen     LAB RESULTS:  No visits with results within 3 Day(s) from this visit. Latest known visit with results is:  Hospital Outpatient Visit on 08/27/2015  Component Date Value Ref Range Status  . 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 Status post high-dose chemotherapy  with stem cell support   PET scan has been reviewed independently and reviewed with the patient and there is no evidence of recurrent disease.  Based on clinical examination as well as by radiological examination.  Patient will be evaluated in 3 months by my associate and patient is aware of my planned retirement    Patient states she has had a few night sweats since her last visit. States she is just damp, not drenched   Exact etiology of night sweat is not clear but in absence of any evidence of recurrent disease it could be menopausal symptoms will continue to observe that.  3.incisional hernia   Diffuse large B cell lymphoma   Staging form: Lymphoid Neoplasms, AJCC 6th Edition     Clinical: Stage III - April Griffon, MD   09/03/2015 8:30 AM

## 2015-09-24 ENCOUNTER — Encounter (HOSPITAL_COMMUNITY): Payer: BC Managed Care – PPO

## 2015-09-30 ENCOUNTER — Ambulatory Visit: Payer: BC Managed Care – PPO | Admitting: Oncology

## 2015-09-30 ENCOUNTER — Other Ambulatory Visit: Payer: BC Managed Care – PPO

## 2016-01-06 ENCOUNTER — Other Ambulatory Visit: Payer: BC Managed Care – PPO

## 2016-01-06 ENCOUNTER — Ambulatory Visit: Payer: BC Managed Care – PPO | Admitting: Internal Medicine

## 2016-06-23 ENCOUNTER — Other Ambulatory Visit: Payer: Self-pay | Admitting: Internal Medicine

## 2016-06-23 DIAGNOSIS — Z1231 Encounter for screening mammogram for malignant neoplasm of breast: Secondary | ICD-10-CM

## 2016-06-26 IMAGING — MG MM DIGITAL SCREENING BILAT W/ CAD
3 series · 3 of 3 positions shown · non-contrast
Comparison: Previous exam(s).

CLINICAL DATA: Screening.

EXAM:
DIGITAL SCREENING BILATERAL MAMMOGRAM WITH CAD

[L MLO]
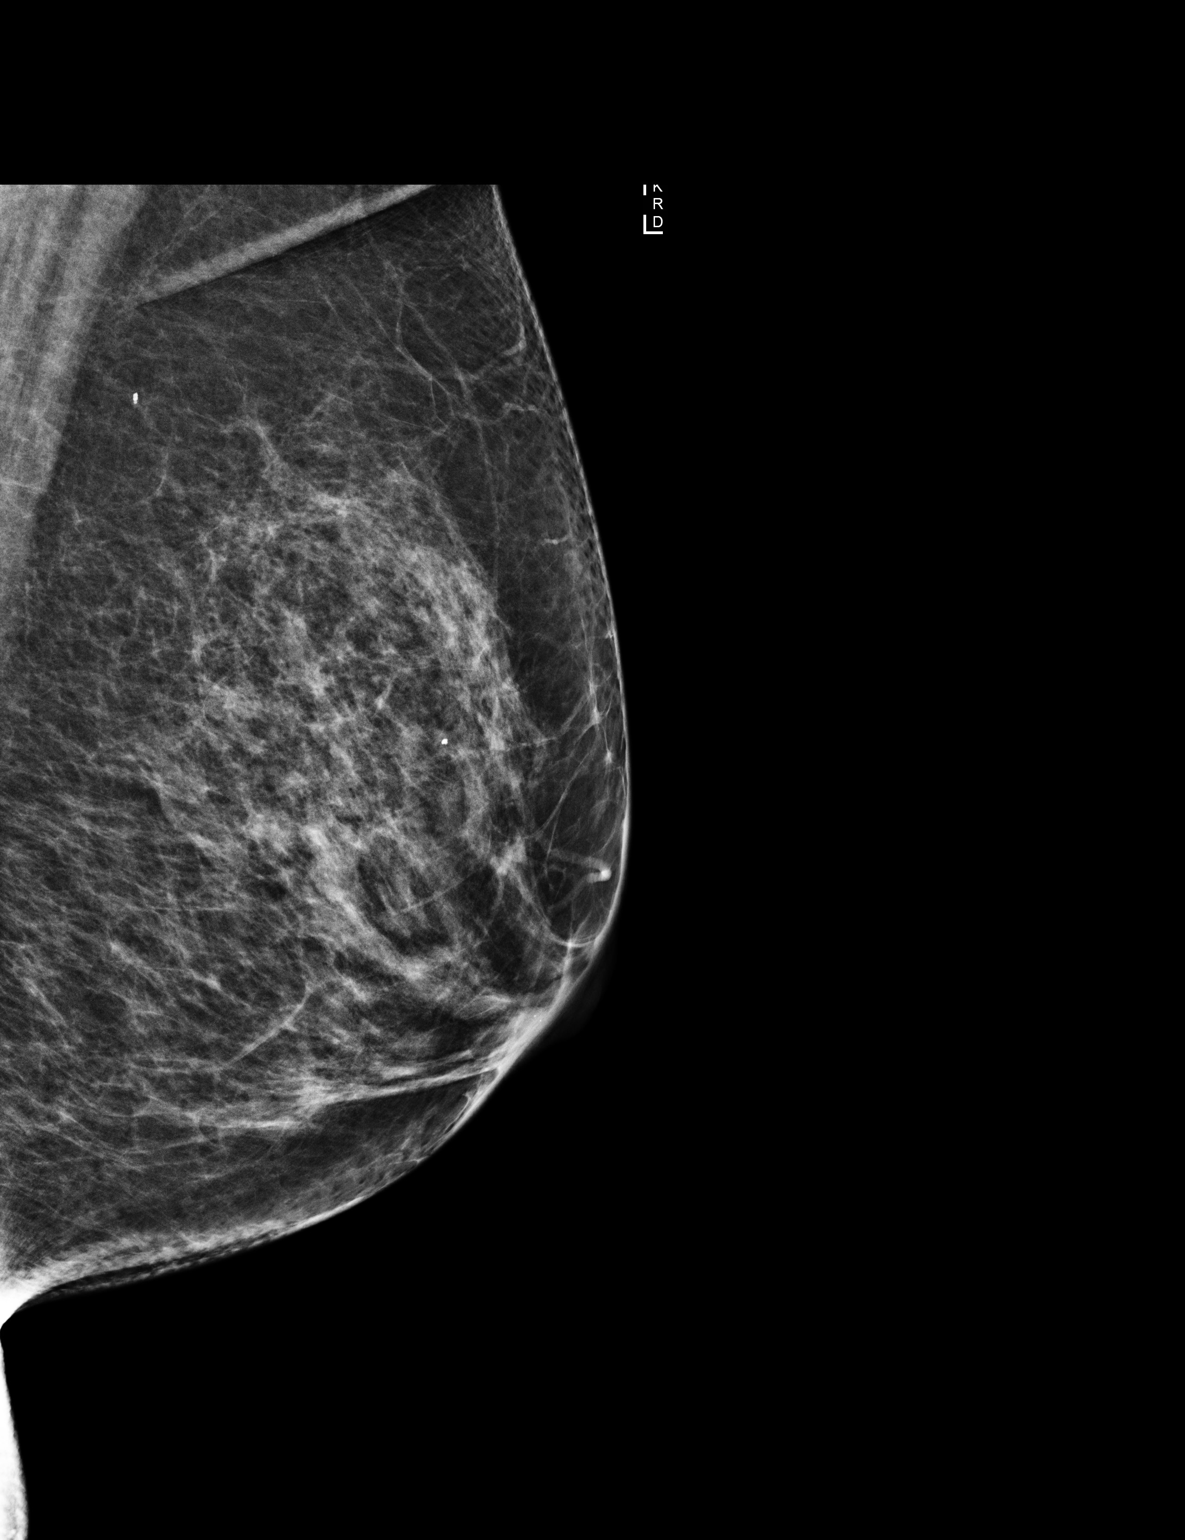

[R CC]
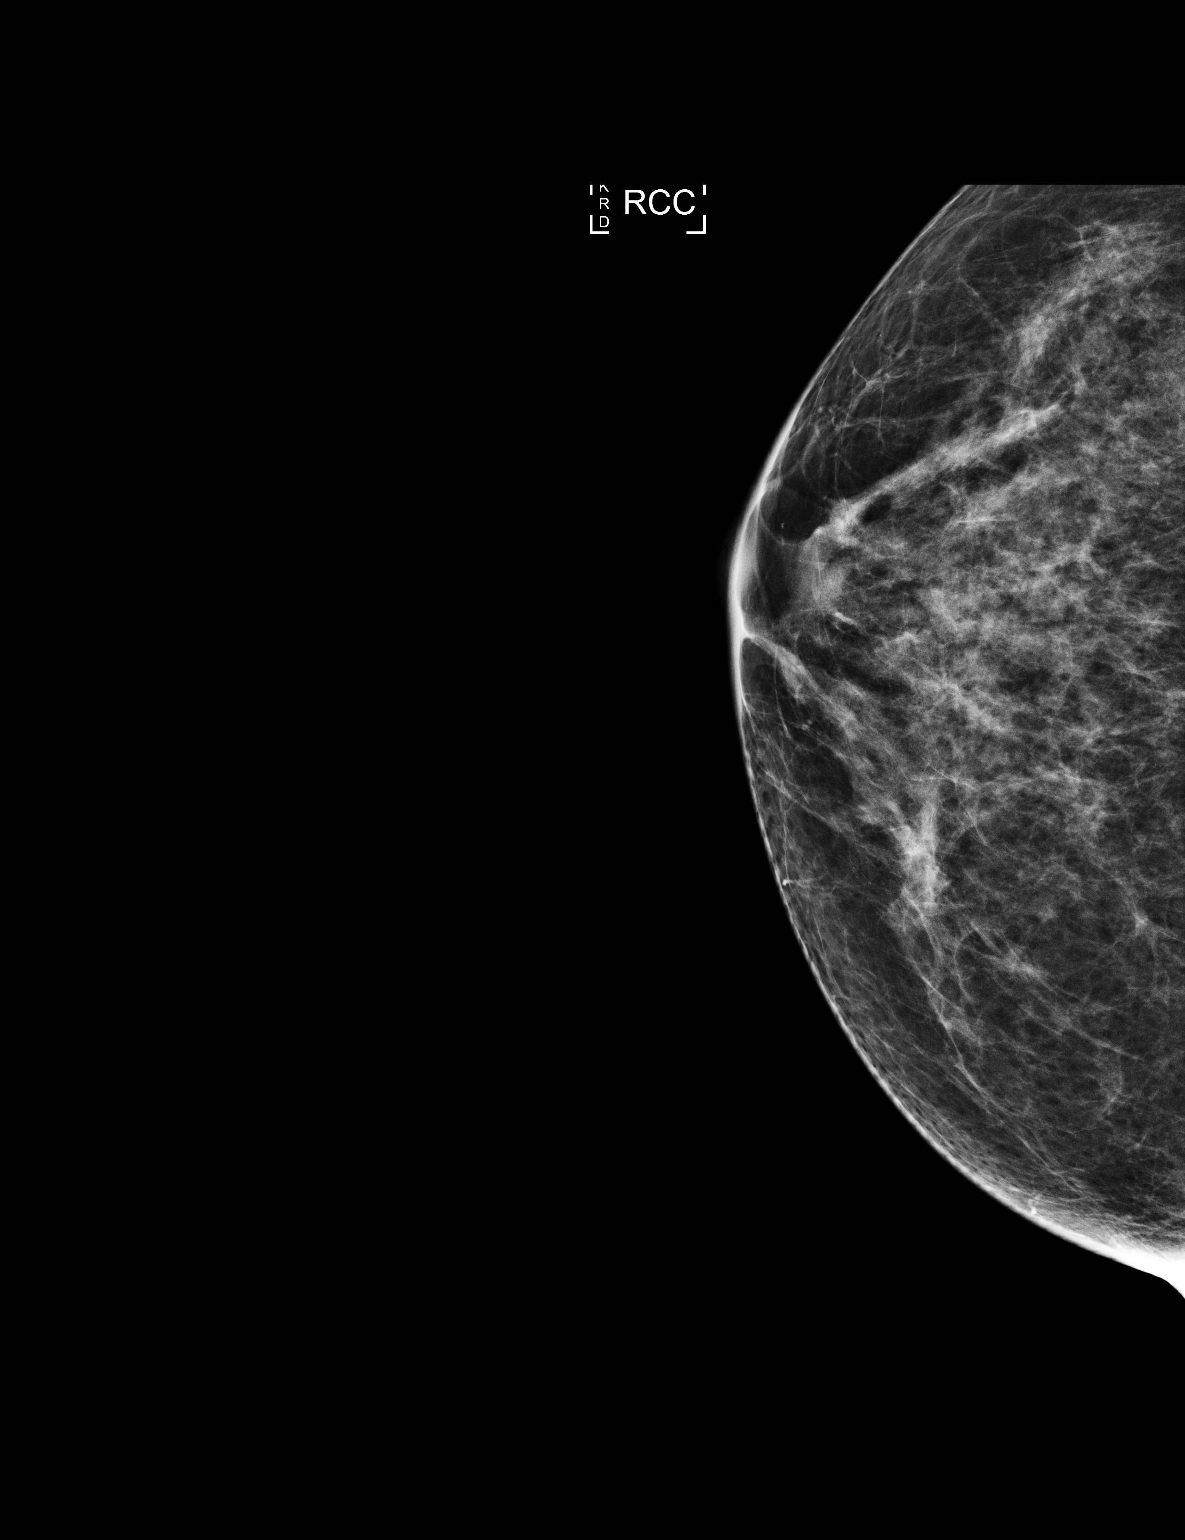

[R MLO]
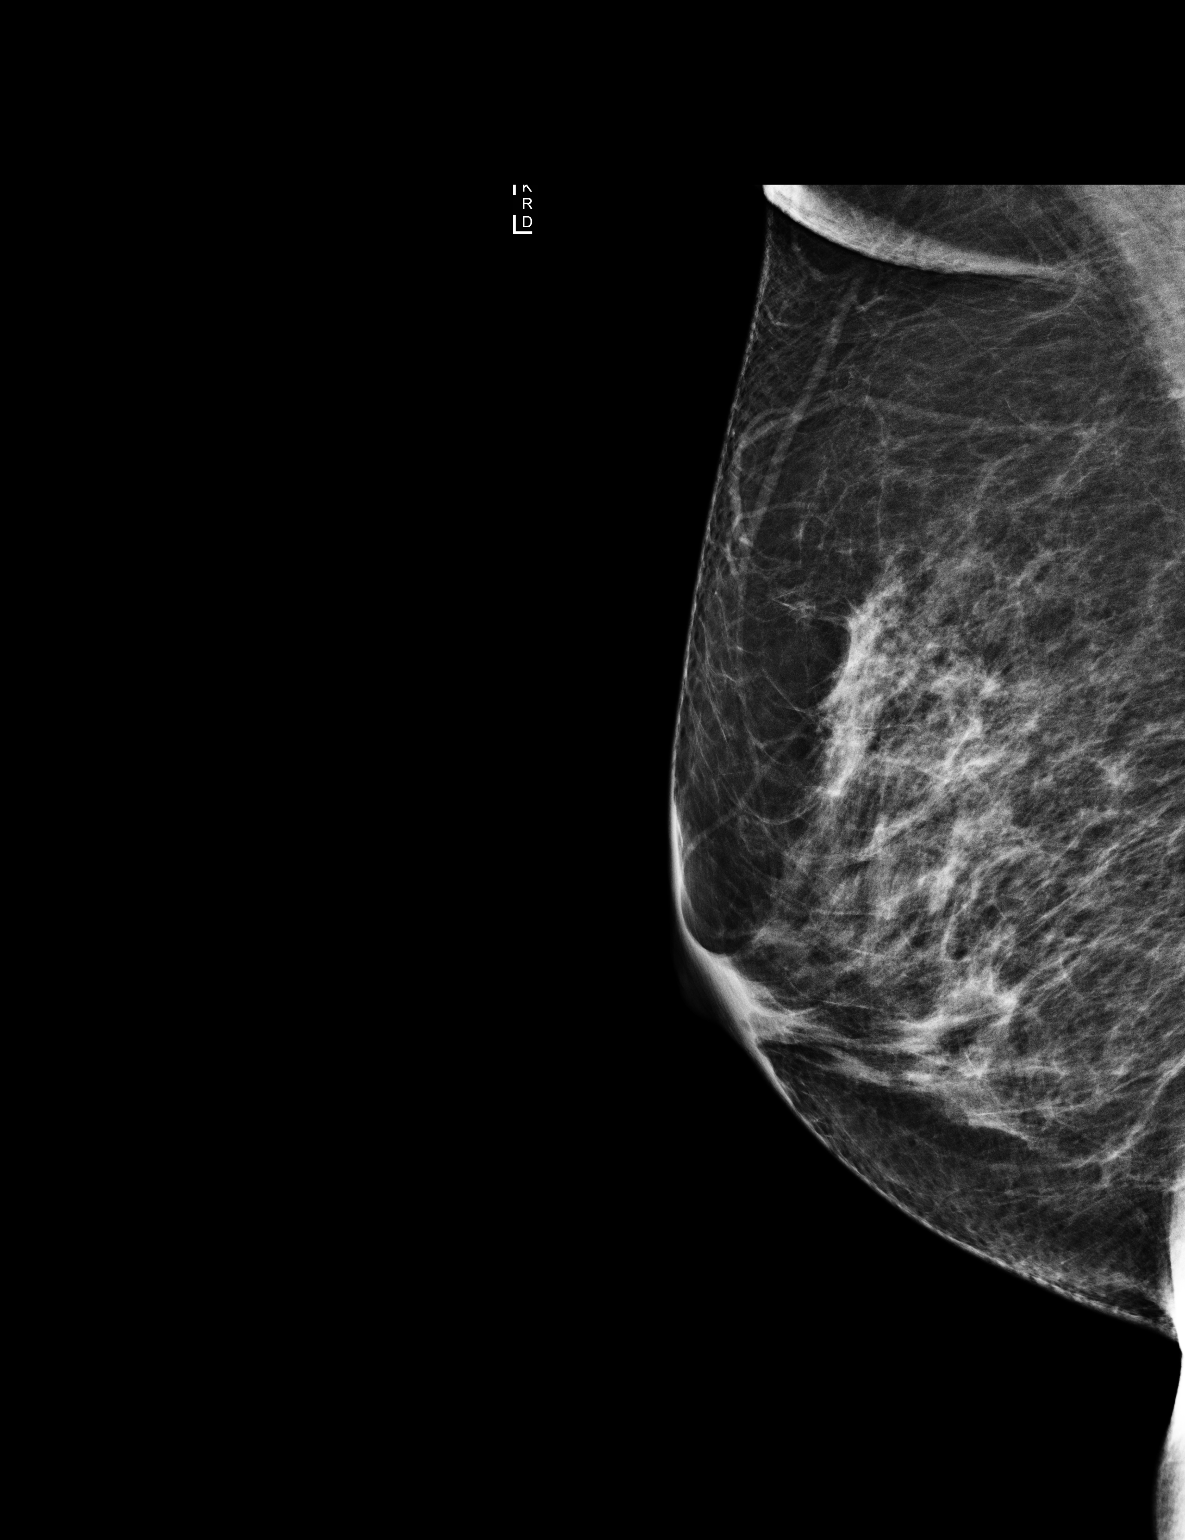

[3 of 3 positions shown; findings below may reference images not displayed]

ACR Breast Density Category b: There are scattered areas of
fibroglandular density.
FINDINGS: In the left breast, a possible asymmetry warrants further
evaluation. In the right breast, no findings suspicious for
malignancy. Images were processed with CAD.
IMPRESSION: Further evaluation is suggested for possible asymmetry in the left
breast.

RECOMMENDATION:
Diagnostic mammogram and possibly ultrasound of the left breast.
(Code:6F-[id number])

The patient will be contacted regarding the findings, and additional
imaging will be scheduled.

BI-RADS CATEGORY  0: Incomplete. Need additional imaging evaluation
and/or prior mammograms for comparison.

## 2016-07-26 ENCOUNTER — Ambulatory Visit
Admission: RE | Admit: 2016-07-26 | Discharge: 2016-07-26 | Disposition: A | Discharge status: Home / Self Care | Payer: BC Managed Care – PPO | Source: Ambulatory Visit | Attending: Internal Medicine | Admitting: Internal Medicine

## 2016-07-26 ENCOUNTER — Other Ambulatory Visit: Payer: Self-pay | Admitting: Internal Medicine

## 2016-07-26 DIAGNOSIS — Z1231 Encounter for screening mammogram for malignant neoplasm of breast: Secondary | ICD-10-CM | POA: Insufficient documentation

## 2018-07-26 ENCOUNTER — Other Ambulatory Visit: Payer: Self-pay | Admitting: Internal Medicine

## 2018-07-26 DIAGNOSIS — Z1231 Encounter for screening mammogram for malignant neoplasm of breast: Secondary | ICD-10-CM

## 2018-10-17 ENCOUNTER — Ambulatory Visit
Admission: RE | Admit: 2018-10-17 | Discharge: 2018-10-17 | Disposition: A | Discharge status: Home / Self Care | Payer: BC Managed Care – PPO | Source: Ambulatory Visit | Attending: Internal Medicine | Admitting: Internal Medicine

## 2018-10-17 ENCOUNTER — Other Ambulatory Visit: Payer: Self-pay

## 2018-10-17 DIAGNOSIS — Z1231 Encounter for screening mammogram for malignant neoplasm of breast: Secondary | ICD-10-CM | POA: Diagnosis present

## 2022-04-20 ENCOUNTER — Ambulatory Visit
Admission: EM | Admit: 2022-04-20 | Discharge: 2022-04-20 | Disposition: A | Discharge status: Home / Self Care | Payer: BC Managed Care – PPO | Attending: Urgent Care | Admitting: Urgent Care

## 2022-04-20 DIAGNOSIS — J029 Acute pharyngitis, unspecified: Secondary | ICD-10-CM | POA: Insufficient documentation

## 2022-04-20 LAB — POCT RAPID STREP A (OFFICE): Rapid Strep A Screen: NEGATIVE

## 2022-04-20 MED ORDER — NYSTATIN 100000 UNIT/ML MT SUSP: 5.0000 mL | Freq: Four times a day (QID) | OROMUCOSAL | 0 refills | Status: AC

## 2022-04-20 MED ORDER — AMOXICILLIN 500 MG PO CAPS: 500.0000 mg | ORAL_CAPSULE | Freq: Two times a day (BID) | ORAL | 0 refills | Status: AC

## 2022-04-20 NOTE — ED Triage Notes (Signed)
Pt. Presents to UC w/ c/o a sore throat on one side and also endorses not feeling well. Pt. States she was diagnosed with RSV over 3 weeks ago and symptoms are still lingering.

## 2022-04-20 NOTE — ED Provider Notes (Signed)
April Nolan    CSN: [id number] Arrival date & time: 04/20/22  3818      History   Chief Complaint Chief Complaint  Patient presents with   Sore Throat   Nasal Congestion   Fatigue    HPI April Nolan is a 66 y.o. female.    Sore Throat    Presents to urgent care with complaint of sore throat on 1 side of her throat.  She endorses "not feeling well".  Patient states she was diagnosed with RSV 3 weeks ago and reports continued lingering symptoms.  She presents today with complaint of a "polyp" on the left side of her throat.  She states it is red and painful.  She denies any white spots.    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 Vira Agar   UPPER GI ENDOSCOPY  2004   Dr Vira Agar    OB History     Gravida  0   Para      Term      Preterm      AB      Living         SAB      IAB      Ectopic      Multiple      Live Births           Obstetric Comments  1st Menstrual Cycle:  14  1st Pregnancy:  2          Home Medications    Prior to Admission medications   Medication Sig Start Date End Date Taking? Authorizing Provider  acetaminophen (TYLENOL) 500 MG tablet Take 500 mg by mouth every 6 (six) hours as needed for mild pain.     [provider]  citalopram (CELEXA) 20 MG tablet  TAKE 1 TABLET (20 MG TOTAL) BY MOUTH DAILY. 07/20/15   Forest Gleason, MD  cyclobenzaprine (FLEXERIL) 5 MG tablet TK 1 TO 2 TS PO TID PRF MUSCLE SPASM OR PAIN 07/07/15   [provider]  fluticasone Asencion Islam) 50 MCG/ACT nasal spray  06/30/14   [provider]  fluticasone (VERAMYST) 27.5 MCG/SPRAY nasal spray Place 2 sprays into the nose 2 (two) times daily as needed for allergies.     [provider]  lidocaine-prilocaine (EMLA) cream Apply 1 application topically as needed. 30 min prior to accessing port 09/08/14   Forest Gleason, MD  meloxicam (MOBIC) 7.5 MG tablet TK 1 T PO QD 07/07/15   [provider]  omeprazole (PRILOSEC) 20 MG capsule Take 20 mg by mouth. 03/16/15   [provider]  sulfamethoxazole-trimethoprim (BACTRIM DS,SEPTRA DS) 800-160 MG tablet Take 1 tablet by mouth twice daily on Mondays and Thursdays 02/09/15   [provider]  temazepam (RESTORIL) 15 MG capsule Take 1 capsule (  15 mg total) by mouth at bedtime as needed for sleep. 05/12/15   Forest Gleason, MD  Vitamin D, Ergocalciferol, (DRISDOL) 50000 units CAPS capsule  05/01/15   [provider]  Vitamin D, Ergocalciferol, (DRISDOL) 50000 units CAPS capsule Take by mouth. 06/22/15   [provider]    Family History Family History  Problem Relation Age of Onset   Cancer Father        bone/brain   Breast cancer Paternal Aunt 70    Social History Social History   Tobacco Use   Smoking status: Never  Substance Use Topics   Alcohol use: No    Comment: 4/week   Drug use: No     Allergies   Pollen extract   Review of Systems Review of Systems   Physical Exam Triage Vital Signs ED Triage Vitals [04/20/22 1104]  Enc Vitals Group     BP (!) 143/87     Pulse Rate 84     Resp 16     Temp 98 F (36.7 C)     Temp Source Oral     SpO2 95 %     Weight      Height      Head Circumference      Peak Flow      Pain Score      Pain Loc      Pain  Edu?      Excl. in [place]?    No data found.  Updated Vital Signs BP (!) 143/87 (BP Location: Left Arm)   Pulse 84   Temp 98 F (36.7 C) (Oral)   Resp 16   SpO2 95%   Visual Acuity Right Eye Distance:   Left Eye Distance:   Bilateral Distance:    Right Eye Near:   Left Eye Near:    Bilateral Near:     Physical Exam Vitals reviewed.  Constitutional:      Appearance: She is well-developed. She is not ill-appearing.  HENT:     Mouth/Throat:     Pharynx: Posterior oropharyngeal erythema present.     Tonsils: Tonsillar exudate present.  Skin:    General: Skin is warm and dry.  Neurological:     General: No focal deficit present.     Mental Status: She is alert and oriented to person, place, and time.  Psychiatric:        Mood and Affect: Mood normal.        Behavior: Behavior normal.      UC Treatments / Results  Labs (all labs ordered are listed, but only abnormal results are displayed) Labs Reviewed - No data to display  EKG   Radiology No results found.  Procedures Procedures (including critical care time)  Medications Ordered in UC Medications - No data to display  Initial Impression / Assessment and Plan / UC Course  I have reviewed the triage vital signs and the nursing notes.  Pertinent labs & imaging results that were available during my care of the patient were reviewed by me and considered in my medical decision making (see chart for details).   Patient is afebrile here without recent antipyretics. Satting well on room air. Overall is well appearing, well hydrated, without respiratory distress.  There is pharyngeal erythema, especially on the left side.  There is a tonsillar lesion with peritonsillar exudates present on the left side of her pharynx.  Rapid strep is negative.  Will send for confirmatory culture and treat for presumptive strep  versus fungal infection.  Final Clinical Impressions(s) / UC Diagnoses   Final diagnoses:  None    Discharge Instructions   None    ED Prescriptions   None    PDMP not reviewed this encounter.   Rose Phi, Loyal 04/20/22 1135

## 2022-04-20 NOTE — Discharge Instructions (Addendum)
Follow up here or with your primary care provider if your symptoms are worsening or not improving with treatment.     

## 2022-04-23 LAB — CULTURE, GROUP A STREP (THRC)

## 2024-07-08 ENCOUNTER — Ambulatory Visit
